# Patient Record
Sex: Male | Born: 1955 | Race: White | Hispanic: No | Marital: Married | Smoking: Never smoker
Health system: Southern US, Community
[De-identification: ages and names within clinical notes are randomized; demographics above are authoritative.]

## PROBLEM LIST (undated history)

## (undated) DIAGNOSIS — K219 Gastro-esophageal reflux disease without esophagitis: Secondary | ICD-10-CM

## (undated) DIAGNOSIS — R0601 Orthopnea: Secondary | ICD-10-CM

## (undated) DIAGNOSIS — E785 Hyperlipidemia, unspecified: Secondary | ICD-10-CM

## (undated) DIAGNOSIS — F0390 Unspecified dementia without behavioral disturbance: Secondary | ICD-10-CM

## (undated) DIAGNOSIS — I10 Essential (primary) hypertension: Secondary | ICD-10-CM

## (undated) DIAGNOSIS — J45909 Unspecified asthma, uncomplicated: Secondary | ICD-10-CM

## (undated) HISTORY — PX: TONSILLECTOMY: SUR1361

## (undated) HISTORY — PX: HERNIA REPAIR: SHX51

---

## 2004-07-08 ENCOUNTER — Emergency Department: Payer: Self-pay | Admitting: Emergency Medicine

## 2008-02-09 ENCOUNTER — Emergency Department: Payer: Self-pay | Admitting: Emergency Medicine

## 2014-04-10 ENCOUNTER — Emergency Department
Admit: 2014-04-10 | Disposition: A | Discharge status: Home / Self Care | Payer: Self-pay | Admitting: Emergency Medicine

## 2014-04-10 LAB — CBC WITH DIFFERENTIAL/PLATELET
BASOS ABS: 0.1 10*3/uL (ref 0.0–0.1)
Basophil %: 1.1 %
Eosinophil #: 0.3 10*3/uL (ref 0.0–0.7)
Eosinophil %: 3.1 %
HCT: 48.8 % (ref 40.0–52.0)
HGB: 16.6 g/dL (ref 13.0–18.0)
Lymphocyte #: 1.9 10*3/uL (ref 1.0–3.6)
Lymphocyte %: 23.1 %
MCH: 29 pg (ref 26.0–34.0)
MCHC: 34.1 g/dL (ref 32.0–36.0)
MCV: 85 fL (ref 80–100)
MONO ABS: 0.9 x10 3/mm (ref 0.2–1.0)
Monocyte %: 11.4 %
NEUTROS ABS: 5 10*3/uL (ref 1.4–6.5)
Neutrophil %: 61.3 %
Platelet: 215 10*3/uL (ref 150–440)
RBC: 5.73 10*6/uL (ref 4.40–5.90)
RDW: 13.9 % (ref 11.5–14.5)
WBC: 8.2 10*3/uL (ref 3.8–10.6)

## 2014-04-10 LAB — COMPREHENSIVE METABOLIC PANEL
ALK PHOS: 78 U/L
ANION GAP: 8 (ref 7–16)
Albumin: 4.1 g/dL
BILIRUBIN TOTAL: 0.8 mg/dL
BUN: 18 mg/dL
CO2: 25 mmol/L
CREATININE: 1.34 mg/dL — AB
Calcium, Total: 9.3 mg/dL
Chloride: 105 mmol/L
EGFR (African American): >60
EGFR (Non-African Amer.): 59 — ABNORMAL LOW
Glucose: 106 mg/dL — ABNORMAL HIGH
Potassium: 3.6 mmol/L
SGOT(AST): 23 U/L
SGPT (ALT): 26 U/L
Sodium: 138 mmol/L
TOTAL PROTEIN: 7.2 g/dL

## 2014-04-10 LAB — PROTIME-INR
INR: 0.9
PROTHROMBIN TIME: 12.8 s

## 2014-04-10 LAB — APTT: ACTIVATED PTT: 29.7 s (ref 23.6–35.9)

## 2014-04-10 LAB — TROPONIN I: Troponin-I: <0.03 ng/mL

## 2014-12-10 ENCOUNTER — Encounter: Payer: Self-pay | Admitting: *Deleted

## 2014-12-13 ENCOUNTER — Ambulatory Visit: Payer: 59 | Admitting: Anesthesiology

## 2014-12-13 ENCOUNTER — Ambulatory Visit
Admission: RE | Admit: 2014-12-13 | Discharge: 2014-12-13 | Disposition: A | Discharge status: Home / Self Care | Payer: 59 | Source: Ambulatory Visit | Attending: Gastroenterology | Admitting: Gastroenterology

## 2014-12-13 ENCOUNTER — Encounter
Admission: RE | Disposition: A | Discharge status: Home / Self Care | Payer: Self-pay | Source: Ambulatory Visit | Attending: Gastroenterology

## 2014-12-13 ENCOUNTER — Encounter: Payer: Self-pay | Admitting: Anesthesiology

## 2014-12-13 DIAGNOSIS — Z7982 Long term (current) use of aspirin: Secondary | ICD-10-CM | POA: Insufficient documentation

## 2014-12-13 DIAGNOSIS — K219 Gastro-esophageal reflux disease without esophagitis: Secondary | ICD-10-CM | POA: Diagnosis not present

## 2014-12-13 DIAGNOSIS — R1310 Dysphagia, unspecified: Secondary | ICD-10-CM | POA: Insufficient documentation

## 2014-12-13 DIAGNOSIS — K210 Gastro-esophageal reflux disease with esophagitis: Secondary | ICD-10-CM | POA: Insufficient documentation

## 2014-12-13 DIAGNOSIS — Z79899 Other long term (current) drug therapy: Secondary | ICD-10-CM | POA: Insufficient documentation

## 2014-12-13 DIAGNOSIS — Z1211 Encounter for screening for malignant neoplasm of colon: Secondary | ICD-10-CM | POA: Diagnosis present

## 2014-12-13 DIAGNOSIS — D125 Benign neoplasm of sigmoid colon: Secondary | ICD-10-CM | POA: Diagnosis not present

## 2014-12-13 DIAGNOSIS — I10 Essential (primary) hypertension: Secondary | ICD-10-CM | POA: Insufficient documentation

## 2014-12-13 DIAGNOSIS — E785 Hyperlipidemia, unspecified: Secondary | ICD-10-CM | POA: Diagnosis not present

## 2014-12-13 DIAGNOSIS — J45909 Unspecified asthma, uncomplicated: Secondary | ICD-10-CM | POA: Diagnosis not present

## 2014-12-13 DIAGNOSIS — K5730 Diverticulosis of large intestine without perforation or abscess without bleeding: Secondary | ICD-10-CM | POA: Insufficient documentation

## 2014-12-13 HISTORY — PX: ESOPHAGOGASTRODUODENOSCOPY (EGD) WITH PROPOFOL: SHX5813

## 2014-12-13 HISTORY — DX: Gastro-esophageal reflux disease without esophagitis: K21.9

## 2014-12-13 HISTORY — DX: Hyperlipidemia, unspecified: E78.5

## 2014-12-13 HISTORY — DX: Orthopnea: R06.01

## 2014-12-13 HISTORY — PX: COLONOSCOPY WITH PROPOFOL: SHX5780

## 2014-12-13 HISTORY — DX: Essential (primary) hypertension: I10

## 2014-12-13 HISTORY — DX: Unspecified asthma, uncomplicated: J45.909

## 2014-12-13 SURGERY — ESOPHAGOGASTRODUODENOSCOPY (EGD) WITH PROPOFOL
Anesthesia: General

## 2014-12-13 MED ORDER — SODIUM CHLORIDE 0.9 % IV SOLN: INTRAVENOUS | Status: DC

## 2014-12-13 MED ORDER — PROPOFOL 500 MG/50ML IV EMUL
INTRAVENOUS | Status: DC | PRN
  Administered 2014-12-13: 140 ug/kg/min via INTRAVENOUS

## 2014-12-13 MED ORDER — LIDOCAINE HCL (CARDIAC) 20 MG/ML IV SOLN
INTRAVENOUS | Status: DC | PRN
  Administered 2014-12-13: 60 mg via INTRAVENOUS

## 2014-12-13 MED ORDER — SODIUM CHLORIDE 0.9 % IV SOLN
INTRAVENOUS | Status: DC
  Administered 2014-12-13: 1000 mL via INTRAVENOUS

## 2014-12-13 MED ORDER — GLYCOPYRROLATE 0.2 MG/ML IJ SOLN
INTRAMUSCULAR | Status: DC | PRN
  Administered 2014-12-13: 0.2 mg via INTRAVENOUS

## 2014-12-13 MED ORDER — IPRATROPIUM-ALBUTEROL 0.5-2.5 (3) MG/3ML IN SOLN: 3.0000 mL | Freq: Once | RESPIRATORY_TRACT | Status: DC

## 2014-12-13 MED ORDER — MIDAZOLAM HCL 2 MG/2ML IJ SOLN
INTRAMUSCULAR | Status: DC | PRN
  Administered 2014-12-13: 2 mg via INTRAVENOUS

## 2014-12-13 MED ORDER — PROPOFOL 10 MG/ML IV BOLUS
INTRAVENOUS | Status: DC | PRN
  Administered 2014-12-13: 20 mg via INTRAVENOUS
  Administered 2014-12-13: 30 mg via INTRAVENOUS
  Administered 2014-12-13: 20 mg via INTRAVENOUS

## 2014-12-13 NOTE — Anesthesia Procedure Notes (Signed)
Date/Time: 12/13/2014 9:34 AM Performed by: Johnna Acosta Pre-anesthesia Checklist: Patient identified, Emergency Drugs available, Suction available, Patient being monitored and Timeout performed Patient Re-evaluated:Patient Re-evaluated prior to inductionOxygen Delivery Method: Nasal cannula

## 2014-12-13 NOTE — H&P (Signed)
  Date of Initial H&P: 12/07/2014  History reviewed, patient examined, no change in status, stable for surgery.

## 2014-12-13 NOTE — Op Note (Signed)
[facility] Gastroenterology Patient Name: Ian Allen Procedure Date: 12/13/2014 9:22 AM MRN: [id number] Account #: [id number] Date of Birth: 08-21-1955 Admit Type: Outpatient Age: 59 Room: [facility] ENDO ROOM 4 Gender: Male Note Status: Finalized Procedure:         Upper GI endoscopy Indications:       Dysphagia Providers:         Lupita Dawn. Candace Cruise, MD Referring MD:      Forest Gleason Md, MD (Referring MD) Medicines:         Monitored Anesthesia Care Complications:     No immediate complications. Procedure:         Pre-Anesthesia Assessment:                    - Prior to the procedure, a History and Physical was                     performed, and patient medications, allergies and                     sensitivities were reviewed. The patient's tolerance of                     previous anesthesia was reviewed.                    - The risks and benefits of the procedure and the sedation                     options and risks were discussed with the patient. All                     questions were answered and informed consent was obtained.                    - After reviewing the risks and benefits, the patient was                     deemed in satisfactory condition to undergo the procedure.                    After obtaining informed consent, the endoscope was passed                     under direct vision. Throughout the procedure, the                     patient's blood pressure, pulse, and oxygen saturations                     were monitored continuously. The Endoscope was introduced                     through the mouth, and advanced to the second part of                     duodenum. The upper GI endoscopy was accomplished without                     difficulty. The patient tolerated the procedure well. Findings:      LA Grade A (one or more mucosal breaks less than 5 mm, not extending       between tops of 2 mucosal folds) esophagitis was found at the   gastroesophageal junction. Biopsies  were taken with a cold forceps for       histology. The scope was withdrawn. Dilation was performed with a       Maloney dilator with mild resistance at 63 Fr.      The exam was otherwise without abnormality.      The entire examined stomach was normal.      The examined duodenum was normal. Impression:        - LA Grade A reflux esophagitis. Biopsied. Dilated.                    - The examination was otherwise normal.                    - Normal stomach.                    - Normal examined duodenum. Recommendation:    - Discharge patient to home.                    - Observe patient's clinical course.                    - Await pathology results.                    - Use a proton pump inhibitor PO daily. Procedure Code(s): --- Professional ---                    [phone], Esophagogastroduodenoscopy, flexible, transoral;                     with biopsy, single or multiple                    43450, Dilation of esophagus, by unguided sound or bougie,                     single or multiple passes Diagnosis Code(s): --- Professional ---                    K21.0, Gastro-esophageal reflux disease with esophagitis                    R13.10, Dysphagia, unspecified CPT copyright 2014 American Medical Association. All rights reserved. The codes documented in this report are preliminary and upon coder review may  be revised to meet current compliance requirements. Hulen Luster, MD 12/13/2014 9:41:12 AM This report has been signed electronically. Number of Addenda: 0 Note Initiated On: 12/13/2014 9:22 AM      [facility]

## 2014-12-13 NOTE — Transfer of Care (Signed)
Immediate Anesthesia Transfer of Care Note  Patient: Ian Allen  Procedure(s) Performed: Procedure(s): ESOPHAGOGASTRODUODENOSCOPY (EGD) WITH PROPOFOL (N/A) COLONOSCOPY WITH PROPOFOL  Patient Location: PACU  Anesthesia Type:General  Level of Consciousness: sedated  Airway & Oxygen Therapy: Patient Spontanous Breathing and Patient connected to nasal cannula oxygen  Post-op Assessment: Report given to RN and Post -op Vital signs reviewed and stable  Post vital signs: Reviewed and stable  Last Vitals:  Filed Vitals:   12/13/14 0901 12/13/14 1005  BP: 123/97 89/70  Pulse: 86 84  Temp: 36.7 C 36.2 C  Resp: 16 16    Complications: No apparent anesthesia complications

## 2014-12-13 NOTE — Op Note (Signed)
[facility] Gastroenterology Patient Name: Ian Allen Procedure Date: 12/13/2014 9:24 AM MRN: [id number] Account #: [id number] Date of Birth: [birth year] Admit Type: Outpatient Age: 59 Room: [facility] ENDO ROOM 4 Gender: Male Note Status: Finalized Procedure:         Colonoscopy Indications:       Screening for colorectal malignant neoplasm Providers:         Lupita Dawn. Candace Cruise, MD Referring MD:      Forest Gleason Md, MD (Referring MD) Medicines:         Monitored Anesthesia Care Complications:     No immediate complications. Procedure:         Pre-Anesthesia Assessment:                    - Prior to the procedure, a History and Physical was                     performed, and patient medications, allergies and                     sensitivities were reviewed. The patient's tolerance of                     previous anesthesia was reviewed.                    - The risks and benefits of the procedure and the sedation                     options and risks were discussed with the patient. All                     questions were answered and informed consent was obtained.                    - After reviewing the risks and benefits, the patient was                     deemed in satisfactory condition to undergo the procedure.                    After obtaining informed consent, the colonoscope was                     passed under direct vision. Throughout the procedure, the                     patient's blood pressure, pulse, and oxygen saturations                     were monitored continuously. The Colonoscope was                     introduced through the anus and advanced to the the cecum,                     identified by appendiceal orifice and ileocecal valve. The                     colonoscopy was performed without difficulty. The patient                     tolerated the procedure well. The quality of the bowel  preparation was good. Findings:      Multiple  small and large-mouthed diverticula were found in the sigmoid       colon.      A large polyp was found in the proximal sigmoid colon. The polyp was       pedunculated. The polyp was removed with a hot snare. Resection and       retrieval were complete.      A medium polyp was found in the mid sigmoid colon. The polyp was       sessile. The polyp was removed with a hot snare. Resection and retrieval       were complete.      A small polyp was found in the distal sigmoid colon. The polyp was       sessile. The polyp was removed with a hot snare. Resection and retrieval       were complete.      The exam was otherwise without abnormality. Impression:        - Diverticulosis in the sigmoid colon.                    - One large polyp in the proximal sigmoid colon. Resected                     and retrieved.                    - One medium polyp in the mid sigmoid colon. Resected and                     retrieved.                    - One small polyp in the distal sigmoid colon. Resected                     and retrieved.                    - The examination was otherwise normal. Recommendation:    - Discharge patient to home.                    - Await pathology results.                    - Repeat colonoscopy in 3 years for surveillance based on                     pathology results.                    - The findings and recommendations were discussed with the                     patient. Procedure Code(s): --- Professional ---                    [id number], Colonoscopy, flexible; with removal of tumor(s),                     polyp(s), or other lesion(s) by snare technique Diagnosis Code(s): --- Professional ---                    Z12.11, Encounter for screening for malignant neoplasm of  colon                    D12.5, Benign neoplasm of sigmoid colon                    K57.30, Diverticulosis of large intestine without                     perforation or abscess without  bleeding CPT copyright 2014 American Medical Association. All rights reserved. The codes documented in this report are preliminary and upon coder review may  be revised to meet current compliance requirements. Hulen Luster, MD 12/13/2014 10:02:04 AM This report has been signed electronically. Number of Addenda: 0 Note Initiated On: 12/13/2014 9:24 AM Scope Withdrawal Time: 0 hours 11 minutes 24 seconds  Total Procedure Duration: 0 hours 15 minutes 29 seconds       [facility]

## 2014-12-13 NOTE — Anesthesia Preprocedure Evaluation (Signed)
Anesthesia Evaluation  Patient identified by MRN, date of birth, ID band Patient awake    Reviewed: Allergy & Precautions, H&P , NPO status , Patient's Chart, lab work & pertinent test results  History of Anesthesia Complications Negative for: history of anesthetic complications  Airway Mallampati: II  TM Distance: >3 FB Neck ROM: full    Dental no notable dental hx. (+) Teeth Intact   Pulmonary neg shortness of breath, asthma ,    Pulmonary exam normal breath sounds clear to auscultation       Cardiovascular Exercise Tolerance: Good hypertension, (-) angina(-) Past MI and (-) DOE Normal cardiovascular exam Rhythm:regular Rate:Normal     Neuro/Psych negative neurological ROS  negative psych ROS   GI/Hepatic negative GI ROS, Neg liver ROS, GERD  Controlled,  Endo/Other  negative endocrine ROS  Renal/GU negative Renal ROS  negative genitourinary   Musculoskeletal   Abdominal   Peds  Hematology negative hematology ROS (+)   Anesthesia Other Findings Past Medical History:   Asthma                                                       GERD (gastroesophageal reflux disease)                       Hypertension                                                 Orthopnea                                                    Hyperlipidemia                                              Past Surgical History:   TONSILLECTOMY                                                 HERNIA REPAIR                                                   Reproductive/Obstetrics negative OB ROS                             Anesthesia Physical Anesthesia Plan  ASA: III  Anesthesia Plan: General   Post-op Pain Management:    Induction:   Airway Management Planned:   Additional Equipment:   Intra-op Plan:   Post-operative Plan:   Informed Consent: I have reviewed the patients History and Physical, chart, labs  and discussed the procedure including the risks, benefits  and alternatives for the proposed anesthesia with the patient or authorized representative who has indicated his/her understanding and acceptance.   Dental Advisory Given  Plan Discussed with: Anesthesiologist, CRNA and Surgeon  Anesthesia Plan Comments:         Anesthesia Quick Evaluation

## 2014-12-13 NOTE — Anesthesia Postprocedure Evaluation (Signed)
Anesthesia Post Note  Patient: Ian Allen  Procedure(s) Performed: Procedure(s) (LRB): ESOPHAGOGASTRODUODENOSCOPY (EGD) WITH PROPOFOL (N/A) COLONOSCOPY WITH PROPOFOL  Patient location during evaluation: Endoscopy Anesthesia Type: General Level of consciousness: awake and alert Pain management: pain level controlled Vital Signs Assessment: post-procedure vital signs reviewed and stable Respiratory status: spontaneous breathing, nonlabored ventilation, respiratory function stable and patient connected to nasal cannula oxygen Cardiovascular status: blood pressure returned to baseline and stable Postop Assessment: no signs of nausea or vomiting Anesthetic complications: no    Last Vitals:  Filed Vitals:   12/13/14 1030 12/13/14 1040  BP: 120/97 125/93  Pulse: 65   Temp:    Resp: 15     Last Pain: There were no vitals filed for this visit.               Precious Haws Piscitello

## 2014-12-14 LAB — SURGICAL PATHOLOGY

## 2014-12-15 ENCOUNTER — Encounter: Payer: Self-pay | Admitting: Gastroenterology

## 2015-06-24 ENCOUNTER — Ambulatory Visit
Admission: EM | Admit: 2015-06-24 | Discharge: 2015-06-24 | Disposition: A | Discharge status: Home / Self Care | Payer: 59 | Attending: Family Medicine | Admitting: Family Medicine

## 2015-06-24 DIAGNOSIS — R109 Unspecified abdominal pain: Secondary | ICD-10-CM

## 2015-06-24 DIAGNOSIS — R319 Hematuria, unspecified: Secondary | ICD-10-CM | POA: Diagnosis not present

## 2015-06-24 DIAGNOSIS — N451 Epididymitis: Secondary | ICD-10-CM | POA: Diagnosis not present

## 2015-06-24 DIAGNOSIS — R1010 Upper abdominal pain, unspecified: Secondary | ICD-10-CM | POA: Diagnosis not present

## 2015-06-24 LAB — URINALYSIS COMPLETE WITH MICROSCOPIC (ARMC ONLY)
Bilirubin Urine: NEGATIVE
GLUCOSE, UA: NEGATIVE mg/dL
Ketones, ur: NEGATIVE mg/dL
Leukocytes, UA: NEGATIVE
NITRITE: NEGATIVE
Protein, ur: 30 mg/dL — AB
Specific Gravity, Urine: 1.025 (ref 1.005–1.030)
Squamous Epithelial / LPF: NONE SEEN
pH: 7 (ref 5.0–8.0)

## 2015-06-24 MED ORDER — LEVOFLOXACIN 500 MG PO TABS: 500.0000 mg | ORAL_TABLET | Freq: Every day | ORAL | Status: DC

## 2015-06-24 MED ORDER — KETOROLAC TROMETHAMINE 60 MG/2ML IM SOLN
60.0000 mg | Freq: Once | INTRAMUSCULAR | Status: AC
  Administered 2015-06-24: 60 mg via INTRAMUSCULAR

## 2015-06-24 MED ORDER — HYDROCODONE-ACETAMINOPHEN 5-325 MG PO TABS: ORAL_TABLET | ORAL | Status: DC

## 2015-06-24 MED ORDER — ONDANSETRON 8 MG PO TBDP
8.0000 mg | ORAL_TABLET | Freq: Once | ORAL | Status: AC
  Administered 2015-06-24: 8 mg via ORAL

## 2015-06-24 NOTE — Discharge Instructions (Signed)
Epididymitis °Epididymitis is swelling (inflammation) of the epididymis. The epididymis is a cord-like structure that is located along the top and back part of the testicle. It collects and stores sperm from the testicle. °This condition can also cause pain and swelling of the testicle and scrotum. Symptoms usually start suddenly (acute epididymitis). Sometimes epididymitis starts gradually and lasts for a while (chronic epididymitis). This type may be harder to treat. °CAUSES °In men 35 and younger, this condition is usually caused by a bacterial infection or sexually transmitted disease (STD), such as: °· Gonorrhea. °· Chlamydia.   °In men 35 and older who do not have anal sex, this condition is usually caused by bacteria from a blockage or abnormalities in the urinary system. These can result from: °· Having a tube placed into the bladder (urinary catheter). °· Having an enlarged or inflamed prostate gland. °· Having recent urinary tract surgery. °In men who have a condition that weakens the body's defense system (immune system), such as HIV, this condition can be caused by:  °· Other bacteria, including tuberculosis and syphilis. °· Viruses. °· Fungi. °Sometimes this condition occurs without infection. That may happen if urine flows backward into the epididymis after heavy lifting or straining. °RISK FACTORS °This condition is more likely to develop in men: °· Who have unprotected sex with more than one partner. °· Who have anal sex.   °· Who have recently had surgery.   °· Who have a urinary catheter. °· Who have urinary problems. °· Who have a suppressed immune system.   °SYMPTOMS  °This condition usually begins suddenly with chills, fever, and pain behind the scrotum and in the testicle. Other symptoms include:  °· Swelling of the scrotum, testicle, or both. °· Pain when ejaculating or urinating. °· Pain in the back or belly. °· Nausea. °· Itching and discharge from the penis. °· Frequent need to pass  urine. °· Redness and tenderness of the scrotum. °DIAGNOSIS °Your health care provider can diagnose this condition based on your symptoms and medical history. Your health care provider will also do a physical exam to ask about your symptoms and check your scrotum and testicle for swelling, pain, and redness. You may also have other tests, including:   °· Examination of discharge from the penis. °· Urine tests for infections, such as STDs.   °Your health care provider may test you for other STDs, including HIV.  °TREATMENT °Treatment for this condition depends on the cause. If your condition is caused by a bacterial infection, oral antibiotic medicine may be prescribed. If the bacterial infection has spread to your blood, you may need to receive IV antibiotics. Nonbacterial epididymitis is treated with home care that includes bed rest and elevation of the scrotum. °Surgery may be needed to treat: °· Bacterial epididymitis that causes pus to build up in the scrotum (abscess). °· Chronic epididymitis that has not responded to other treatments. °HOME CARE INSTRUCTIONS °Medicines  °· Take over-the-counter and prescription medicines only as told by your health care provider.   °· If you were prescribed an antibiotic medicine, take it as told by your health care provider. Do not stop taking the antibiotic even if your condition improves. °Sexual Activity  °· If your epididymitis was caused by an STD, avoid sexual activity until your treatment is complete. °· Inform your sexual partner or partners if you test positive for an STD. They may need to be treated. Do not engage in sexual activity with your partner or partners until their treatment is completed. °General Instructions  °· Return to your normal activities as told   by your health care provider. Ask your health care provider what activities are safe for you.  Keep your scrotum elevated and supported while resting. Ask your health care provider if you should wear a  scrotal support, such as a jockstrap. Wear it as told by your health care provider.  If directed, apply ice to the affected area:   Put ice in a plastic bag.  Place a towel between your skin and the bag.  Leave the ice on for 20 minutes, 2-3 times per day.  Try taking a sitz bath to help with discomfort. This is a warm water bath that is taken while you are sitting down. The water should only come up to your hips and should cover your buttocks. Do this 3-4 times per day or as told by your health care provider.  Keep all follow-up visits as told by your health care provider. This is important. SEEK MEDICAL CARE IF:   You have a fever.   Your pain medicine is not helping.   Your pain is getting worse.   Your symptoms do not improve within three days.   This information is not intended to replace advice given to you by your health care provider. Make sure you discuss any questions you have with your health care provider.   Document Released: 12/16/1999 Document Revised: 09/08/2014 Document Reviewed: 05/05/2014 Elsevier Interactive Patient Education 2016 [place]. Kidney Stones Kidney stones (urolithiasis) are deposits that form inside your kidneys. The intense pain is caused by the stone moving through the urinary tract. When the stone moves, the ureter goes into spasm around the stone. The stone is usually passed in the urine.  CAUSES   A disorder that makes certain neck glands produce too much parathyroid hormone (primary hyperparathyroidism).  A buildup of uric acid crystals, similar to gout in your joints.  Narrowing (stricture) of the ureter.  A kidney obstruction present at birth (congenital obstruction).  Previous surgery on the kidney or ureters.  Numerous kidney infections. SYMPTOMS   Feeling sick to your stomach (nauseous).  Throwing up (vomiting).  Blood in the urine (hematuria).  Pain that usually spreads (radiates) to the groin.  Frequency or  urgency of urination. DIAGNOSIS   Taking a history and physical exam.  Blood or urine tests.  CT scan.  Occasionally, an examination of the inside of the urinary bladder (cystoscopy) is performed. TREATMENT   Observation.  Increasing your fluid intake.  Extracorporeal shock wave lithotripsy--This is a noninvasive procedure that uses shock waves to break up kidney stones.  Surgery may be needed if you have severe pain or persistent obstruction. There are various surgical procedures. Most of the procedures are performed with the use of small instruments. Only small incisions are needed to accommodate these instruments, so recovery time is minimized. The size, location, and chemical composition are all important variables that will determine the proper choice of action for you. Talk to your health care provider to better understand your situation so that you will minimize the risk of injury to yourself and your kidney.  HOME CARE INSTRUCTIONS   Drink enough water and fluids to keep your urine clear or pale yellow. This will help you to pass the stone or stone fragments.  Strain all urine through the provided strainer. Keep all particulate matter and stones for your health care provider to see. The stone causing the pain may be as small as a grain of salt. It is very important to use the strainer each  and every time you pass your urine. The collection of your stone will allow your health care provider to analyze it and verify that a stone has actually passed. The stone analysis will often identify what you can do to reduce the incidence of recurrences.  Only take over-the-counter or prescription medicines for pain, discomfort, or fever as directed by your health care provider.  Keep all follow-up visits as told by your health care provider. This is important.  Get follow-up X-rays if required. The absence of pain does not always mean that the stone has passed. It may have only stopped  moving. If the urine remains completely obstructed, it can cause loss of kidney function or even complete destruction of the kidney. It is your responsibility to make sure X-rays and follow-ups are completed. Ultrasounds of the kidney can show blockages and the status of the kidney. Ultrasounds are not associated with any radiation and can be performed easily in a matter of minutes.  Make changes to your daily diet as told by your health care provider. You may be told to:  Limit the amount of salt that you eat.  Eat 5 or more servings of fruits and vegetables each day.  Limit the amount of meat, poultry, fish, and eggs that you eat.  Collect a 24-hour urine sample as told by your health care provider.You may need to collect another urine sample every 6-12 months. SEEK MEDICAL CARE IF:  You experience pain that is progressive and unresponsive to any pain medicine you have been prescribed. SEEK IMMEDIATE MEDICAL CARE IF:   Pain cannot be controlled with the prescribed medicine.  You have a fever or shaking chills.  The severity or intensity of pain increases over 18 hours and is not relieved by pain medicine.  You develop a new onset of abdominal pain.  You feel faint or pass out.  You are unable to urinate.   This information is not intended to replace advice given to you by your health care provider. Make sure you discuss any questions you have with your health care provider.   Document Released: 12/18/2004 Document Revised: 09/08/2014 Document Reviewed: 05/21/2012 Elsevier Interactive Patient Education [employer].

## 2015-06-24 NOTE — ED Provider Notes (Signed)
CSN: [id number]     Arrival date & time 06/24/15  1342 History   First MD Initiated Contact with Patient 06/24/15 1438     Chief Complaint  Patient presents with  . Back Pain  . Testicle Pain   (Consider location/radiation/quality/duration/timing/severity/associated sxs/prior Treatment) HPI Comments: 60 yo male with a h/o kidney stones in the past presents with a complaint of right flank pain, constant but intermittently worsens, radiating to the groin and associated with nausea. Denies any fevers, chills, vomiting, dysuria, penile discharge, injuries, concern for STD. He does also complain of slight swelling, redness and pain to the right scrotum.   Patient is a 60 y.o. male presenting with back pain and testicular pain. The history is provided by the patient.  Back Pain Testicle Pain    Past Medical History  Diagnosis Date  . Asthma   . GERD (gastroesophageal reflux disease)   . Hypertension   . Orthopnea   . Hyperlipidemia    Past Surgical History  Procedure Laterality Date  . Tonsillectomy    . Hernia repair    . Esophagogastroduodenoscopy (egd) with propofol N/A 12/13/2014    Procedure: ESOPHAGOGASTRODUODENOSCOPY (EGD) WITH PROPOFOL;  Surgeon: Hulen Luster, MD;  Location: [facility] ENDOSCOPY;  Service: Gastroenterology;  Laterality: N/A;  . Colonoscopy with propofol  12/13/2014    Procedure: COLONOSCOPY WITH PROPOFOL;  Surgeon: Hulen Luster, MD;  Location: [facility] ENDOSCOPY;  Service: Gastroenterology;;   Family History  Problem Relation Age of Onset  . Cirrhosis Mother    Social History  Substance Use Topics  . Smoking status: Never Smoker   . Smokeless tobacco: Never Used  . Alcohol Use: 0.0 oz/week    0 Standard drinks or equivalent per week     Comment: rarely    Review of Systems  Genitourinary: Positive for testicular pain.  Musculoskeletal: Positive for back pain.    Allergies  Review of patient's allergies indicates no known allergies.  Home Medications   Prior  to Admission medications   Medication Sig Start Date End Date Taking? Authorizing Provider  amLODipine (NORVASC) 10 MG tablet Take 10 mg by mouth daily.   Yes Historical Provider, MD  levothyroxine (SYNTHROID, LEVOTHROID) 50 MCG tablet Take 50 mcg by mouth daily before breakfast.   Yes Historical Provider, MD  lisinopril (PRINIVIL,ZESTRIL) 20 MG tablet Take 20 mg by mouth daily.   Yes Historical Provider, MD  metoprolol tartrate (LOPRESSOR) 25 MG tablet Take 25 mg by mouth 2 (two) times daily.   Yes Historical Provider, MD  pantoprazole (PROTONIX) 20 MG tablet Take 20 mg by mouth daily.   Yes Historical Provider, MD  sildenafil (VIAGRA) 100 MG tablet Take 100 mg by mouth daily as needed for erectile dysfunction.   Yes Historical Provider, MD  HYDROcodone-acetaminophen (NORCO/VICODIN) 5-325 MG tablet 1-2 tabs po q 8 hours prn 06/24/15   Norval Gable, MD  levofloxacin (LEVAQUIN) 500 MG tablet Take 1 tablet (500 mg total) by mouth daily. 06/24/15   Norval Gable, MD  Sulfacetamide-Prednisolone (VASOCIDIN OP) Apply to eye.    Historical Provider, MD   Meds Ordered and Administered this Visit   Medications  ketorolac (TORADOL) injection 60 mg (60 mg Intramuscular Given 06/24/15 1500)  ondansetron (ZOFRAN-ODT) disintegrating tablet 8 mg (8 mg Oral Given 06/24/15 1459)    BP 174/107 mmHg  Pulse 63  Temp(Src) 97.8 F (36.6 C) (Oral)  Resp 17  Ht 5' 11.5" (1.816 m)  Wt 254 lb (115.214 kg)  BMI 34.94 kg/m2  SpO2 100% No data found.   Physical Exam  Constitutional: He is oriented to person, place, and time. He appears well-developed and well-nourished. No distress.  HENT:  Head: Normocephalic and atraumatic.  Cardiovascular: Normal rate, regular rhythm, normal heart sounds and intact distal pulses.   No murmur heard. Pulmonary/Chest: Effort normal and breath sounds normal. No respiratory distress. He has no wheezes. He has no rales.  Abdominal: Soft. Bowel sounds are normal. He exhibits no  distension and no mass. There is tenderness (mild to moderate to palpation of right flank area; no rebound or guarding). There is no rebound and no guarding.  Genitourinary: Penis normal. Right testis shows swelling (mild) and tenderness. Right testis shows no mass. Right testis is descended. Cremasteric reflex is not absent on the right side. Left testis shows no mass, no swelling and no tenderness. Left testis is descended. Cremasteric reflex is not absent on the left side.  Neurological: He is alert and oriented to person, place, and time.  Skin: No rash noted. He is not diaphoretic.  Nursing note and vitals reviewed.   ED Course  Procedures (including critical care time)  Labs Review Labs Reviewed  URINALYSIS COMPLETEWITH MICROSCOPIC ([facility] ONLY) - Abnormal; Notable for the following:    Color, Urine BROWN (*)    APPearance CLOUDY (*)    Hgb urine dipstick 3+ (*)    Protein, ur 30 (*)    Bacteria, UA RARE (*)    All other components within normal limits    Imaging Review No results found.   Visual Acuity Review  Right Eye Distance:   Left Eye Distance:   Bilateral Distance:    Right Eye Near:   Left Eye Near:    Bilateral Near:         MDM   1. Flank pain, acute   2. Hematuria   3. Epididymitis    New Prescriptions   HYDROCODONE-ACETAMINOPHEN (NORCO/VICODIN) 5-325 MG TABLET    1-2 tabs po q 8 hours prn   LEVOFLOXACIN (LEVAQUIN) 500 MG TABLET    Take 1 tablet (500 mg total) by mouth daily.   1. Lab results and diagnoses reviewed with patient; patient given toradol 60mg  IM x1 and zofran 8mg  ODT with improvement of symptoms  2. rx as per orders above; reviewed possible side effects, interactions, risks and benefits  3. Recommend supportive treatment with increased water intake 4. Follow-up prn if symptoms worsen or don't improve    Norval Gable, MD 06/24/15 [phone]

## 2015-06-24 NOTE — ED Notes (Signed)
Patient complains of back pain, abdominal pain and testicle pain. Patient states that he woke up with pain this morning. Patient states that his testicles are aching and feel heavy. Patient states that he has been throwing up and has taken some pepto bismol.

## 2018-08-26 ENCOUNTER — Other Ambulatory Visit: Payer: Self-pay | Admitting: Gastroenterology

## 2018-08-26 DIAGNOSIS — K219 Gastro-esophageal reflux disease without esophagitis: Secondary | ICD-10-CM

## 2018-08-26 DIAGNOSIS — R1310 Dysphagia, unspecified: Secondary | ICD-10-CM

## 2018-09-02 ENCOUNTER — Ambulatory Visit
Admission: RE | Admit: 2018-09-02 | Discharge: 2018-09-02 | Disposition: A | Discharge status: Home / Self Care | Payer: 59 | Source: Ambulatory Visit | Attending: Gastroenterology | Admitting: Gastroenterology

## 2018-09-02 ENCOUNTER — Other Ambulatory Visit: Payer: Self-pay

## 2018-09-02 DIAGNOSIS — R1310 Dysphagia, unspecified: Secondary | ICD-10-CM | POA: Insufficient documentation

## 2018-09-02 DIAGNOSIS — K219 Gastro-esophageal reflux disease without esophagitis: Secondary | ICD-10-CM | POA: Diagnosis present

## 2018-10-15 ENCOUNTER — Other Ambulatory Visit (HOSPITAL_COMMUNITY): Payer: Self-pay | Admitting: Gastroenterology

## 2018-10-15 ENCOUNTER — Other Ambulatory Visit: Payer: Self-pay | Admitting: Gastroenterology

## 2018-10-15 DIAGNOSIS — K222 Esophageal obstruction: Secondary | ICD-10-CM

## 2018-10-15 DIAGNOSIS — R933 Abnormal findings on diagnostic imaging of other parts of digestive tract: Secondary | ICD-10-CM

## 2018-10-23 ENCOUNTER — Ambulatory Visit
Admission: RE | Admit: 2018-10-23 | Discharge: 2018-10-23 | Disposition: A | Discharge status: Home / Self Care | Payer: Managed Care, Other (non HMO) | Source: Ambulatory Visit | Attending: Gastroenterology | Admitting: Gastroenterology

## 2018-10-23 ENCOUNTER — Other Ambulatory Visit: Payer: Self-pay

## 2018-10-23 DIAGNOSIS — R933 Abnormal findings on diagnostic imaging of other parts of digestive tract: Secondary | ICD-10-CM | POA: Diagnosis present

## 2018-10-23 DIAGNOSIS — K222 Esophageal obstruction: Secondary | ICD-10-CM | POA: Insufficient documentation

## 2018-10-23 LAB — POCT I-STAT CREATININE: Creatinine, Ser: 1.1 mg/dL (ref 0.61–1.24)

## 2018-10-23 MED ORDER — IOHEXOL 300 MG/ML  SOLN
75.0000 mL | Freq: Once | INTRAMUSCULAR | Status: AC | PRN
  Administered 2018-10-23: 75 mL via INTRAVENOUS

## 2019-02-17 ENCOUNTER — Ambulatory Visit: Payer: Managed Care, Other (non HMO) | Attending: Internal Medicine

## 2019-02-17 DIAGNOSIS — Z20822 Contact with and (suspected) exposure to covid-19: Secondary | ICD-10-CM

## 2019-02-18 LAB — NOVEL CORONAVIRUS, NAA: SARS-CoV-2, NAA: NOT DETECTED

## 2019-03-12 ENCOUNTER — Other Ambulatory Visit (HOSPITAL_COMMUNITY): Payer: Self-pay | Admitting: Neurology

## 2019-03-12 DIAGNOSIS — R4189 Other symptoms and signs involving cognitive functions and awareness: Secondary | ICD-10-CM

## 2019-04-03 ENCOUNTER — Other Ambulatory Visit: Payer: Self-pay

## 2019-04-03 ENCOUNTER — Ambulatory Visit (HOSPITAL_COMMUNITY)
Admission: RE | Admit: 2019-04-03 | Discharge: 2019-04-03 | Disposition: A | Discharge status: Home / Self Care | Payer: Managed Care, Other (non HMO) | Source: Ambulatory Visit | Attending: Neurology | Admitting: Neurology

## 2019-04-03 DIAGNOSIS — R4189 Other symptoms and signs involving cognitive functions and awareness: Secondary | ICD-10-CM | POA: Diagnosis present

## 2019-04-03 MED ORDER — GADOBUTROL 1 MMOL/ML IV SOLN
10.0000 mL | Freq: Once | INTRAVENOUS | Status: AC | PRN
  Administered 2019-04-03: 10 mL via INTRAVENOUS

## 2019-05-05 ENCOUNTER — Ambulatory Visit: Payer: Managed Care, Other (non HMO) | Attending: Internal Medicine

## 2019-05-05 DIAGNOSIS — Z23 Encounter for immunization: Secondary | ICD-10-CM

## 2019-05-05 NOTE — Progress Notes (Signed)
   Covid-19 Vaccination Clinic  Name:  Ian Allen    MRN: [id number] DOB: Oct 31, 1955  05/05/2019  Mr. Churchwell was observed post Covid-19 immunization for 15 minutes without incident. He was provided with Vaccine Information Sheet and instruction to access the V-Safe system.   Mr. Boelter was instructed to call 911 with any severe reactions post vaccine: Marland Kitchen Difficulty breathing  . Swelling of face and throat  . A fast heartbeat  . A bad rash all over body  . Dizziness and weakness   Immunizations Administered    Name Date Dose VIS Date Route   Moderna COVID-19 Vaccine 05/05/2019  5:08 PM 0.5 mL 12/2018 Intramuscular   Manufacturer: Moderna   Lot: [id number]   [id number]

## 2020-01-06 ENCOUNTER — Other Ambulatory Visit: Payer: Self-pay

## 2020-01-06 ENCOUNTER — Other Ambulatory Visit: Payer: Managed Care, Other (non HMO)

## 2020-01-06 DIAGNOSIS — Z20822 Contact with and (suspected) exposure to covid-19: Secondary | ICD-10-CM

## 2020-01-09 LAB — NOVEL CORONAVIRUS, NAA: SARS-CoV-2, NAA: DETECTED — AB

## 2020-06-15 ENCOUNTER — Encounter: Payer: Self-pay | Admitting: Intensive Care

## 2020-06-15 ENCOUNTER — Emergency Department
Admission: EM | Admit: 2020-06-15 | Discharge: 2020-06-15 | Disposition: A | Discharge status: Home / Self Care | Payer: Managed Care, Other (non HMO) | Attending: Emergency Medicine | Admitting: Emergency Medicine

## 2020-06-15 ENCOUNTER — Other Ambulatory Visit: Payer: Self-pay

## 2020-06-15 DIAGNOSIS — Z79899 Other long term (current) drug therapy: Secondary | ICD-10-CM | POA: Diagnosis not present

## 2020-06-15 DIAGNOSIS — I10 Essential (primary) hypertension: Secondary | ICD-10-CM | POA: Diagnosis not present

## 2020-06-15 DIAGNOSIS — R1310 Dysphagia, unspecified: Secondary | ICD-10-CM | POA: Diagnosis present

## 2020-06-15 MED ORDER — GLUCAGON HCL (RDNA) 1 MG IJ SOLR
1.0000 mg | Freq: Once | INTRAMUSCULAR | Status: AC
  Filled 2020-06-15: qty 1

## 2020-06-15 MED ORDER — GLUCAGON HCL RDNA (DIAGNOSTIC) 1 MG IJ SOLR
INTRAMUSCULAR | Status: AC
  Administered 2020-06-15: 1 mg via INTRAMUSCULAR
  Filled 2020-06-15: qty 1

## 2020-06-15 NOTE — ED Provider Notes (Signed)
[facility] Emergency Department Provider Note  ____________________________________________   Event Date/Time   First MD Initiated Contact with Patient 06/15/20 1739     (approximate)  I have reviewed the triage vital signs and the nursing notes.   HISTORY  Chief Complaint Aspiration   HPI Ian Allen is a 65 y.o. male with a past medical history of HTN, HDL, GERD and dysphagia followed by GI at [facility] as well as mild cognitive impairment followed by neurology return to clinic who presents for assessment with concerns he got a hot dog stuck in his throat.  Patient states that he was eating a hot dog earlier today when he felt that suddenly could not swallow anything.  He was spitting up anything intractable after that.  He states he received some glucagon in the lobby and felt that the piece of hotdog passed as he felt immediate relief and has been able to drink soda without difficulty since.  He has not had any chest pain, shortness of breath, cough, fevers, vomiting, diarrhea, dysuria, Donnell pain, back pain, headache, earache, sore throat, rash or any other associated recent sick symptoms.  States he has had similar problems in the past.  He denies any other acute concerns and states his feeling that he cannot swallow is now resolved.         Past Medical History:  Diagnosis Date   GERD (gastroesophageal reflux disease)    Hyperlipidemia    Hypertension    Orthopnea     There are no problems to display for this patient.   Past Surgical History:  Procedure Laterality Date   COLONOSCOPY WITH PROPOFOL  12/13/2014   Procedure: COLONOSCOPY WITH PROPOFOL;  Surgeon: Hulen Luster, MD;  Location: [facility] ENDOSCOPY;  Service: Gastroenterology;;   ESOPHAGOGASTRODUODENOSCOPY (EGD) WITH PROPOFOL N/A 12/13/2014   Procedure: ESOPHAGOGASTRODUODENOSCOPY (EGD) WITH PROPOFOL;  Surgeon: Hulen Luster, MD;  Location: [facility] ENDOSCOPY;  Service: Gastroenterology;  Laterality:  N/A;   HERNIA REPAIR     TONSILLECTOMY      Prior to Admission medications   Medication Sig Start Date End Date Taking? Authorizing Provider  amLODipine (NORVASC) 10 MG tablet Take 10 mg by mouth daily.    [provider]  HYDROcodone-acetaminophen (NORCO/VICODIN) 5-325 MG tablet 1-2 tabs po q 8 hours prn 06/24/15   Norval Gable, MD  levofloxacin (LEVAQUIN) 500 MG tablet Take 1 tablet (500 mg total) by mouth daily. 06/24/15   Norval Gable, MD  levothyroxine (SYNTHROID, LEVOTHROID) 50 MCG tablet Take 50 mcg by mouth daily before breakfast.    [provider]  lisinopril (PRINIVIL,ZESTRIL) 20 MG tablet Take 20 mg by mouth daily.    [provider]  metoprolol tartrate (LOPRESSOR) 25 MG tablet Take 25 mg by mouth 2 (two) times daily.    [provider]  pantoprazole (PROTONIX) 20 MG tablet Take 20 mg by mouth daily.    [provider]  sildenafil (VIAGRA) 100 MG tablet Take 100 mg by mouth daily as needed for erectile dysfunction.    [provider]  Sulfacetamide-Prednisolone (VASOCIDIN OP) Apply to eye.    [provider]    Allergies Patient has no known allergies.  Family History  Problem Relation Age of Onset   Cirrhosis Mother     Social History Social History   Tobacco Use   Smoking status: Never   Smokeless tobacco: Never  Substance Use Topics   Alcohol use: Yes    Alcohol/week: 0.0 standard drinks  Comment: rarely   Drug use: No    Review of Systems  Review of Systems  Constitutional:  Negative for chills and fever.  HENT:  Negative for sore throat.   Eyes:  Negative for pain.  Respiratory:  Negative for cough and stridor.   Cardiovascular:  Negative for chest pain.  Gastrointestinal:  Negative for vomiting.  Skin:  Negative for rash.  Neurological:  Negative for seizures, loss of consciousness and headaches.  Psychiatric/Behavioral:  Negative for suicidal ideas.   All other systems reviewed  and are negative.    ____________________________________________   PHYSICAL EXAM:  VITAL SIGNS: ED Triage Vitals  Enc Vitals Group     BP 06/15/20 1532 (!) 143/90     Pulse Rate 06/15/20 1532 74     Resp 06/15/20 1532 (!) 26     Temp 06/15/20 1532 98.6 F (37 C)     Temp Source 06/15/20 1532 Oral     SpO2 --      Weight 06/15/20 1534 254 lb (115.2 kg)     Height 06/15/20 1534 6' (1.829 m)     Head Circumference --      Peak Flow --      Pain Score 06/15/20 1534 10     Pain Loc --      Pain Edu? --      Excl. in [place]? --    Vitals:   06/15/20 1532  BP: (!) 143/90  Pulse: 74  Resp: (!) 26  Temp: 98.6 F (37 C)   Physical Exam Vitals and nursing note reviewed.  Constitutional:      Appearance: He is well-developed.  HENT:     Head: Normocephalic and atraumatic.     Right Ear: External ear normal.     Left Ear: External ear normal.     Nose: Nose normal.     Mouth/Throat:     Mouth: Mucous membranes are moist.     Pharynx: Oropharynx is clear. No oropharyngeal exudate or posterior oropharyngeal erythema.  Eyes:     Conjunctiva/sclera: Conjunctivae normal.  Cardiovascular:     Rate and Rhythm: Normal rate and regular rhythm.     Heart sounds: No murmur heard. Pulmonary:     Effort: Pulmonary effort is normal. No respiratory distress.     Breath sounds: Normal breath sounds. No stridor.  Abdominal:     Palpations: Abdomen is soft.     Tenderness: There is no abdominal tenderness.  Musculoskeletal:     Cervical back: Neck supple.  Skin:    General: Skin is warm and dry.     Capillary Refill: Capillary refill takes less than 2 seconds.  Neurological:     Mental Status: He is alert and oriented to person, place, and time.     ____________________________________________   LABS (all labs ordered are listed, but only abnormal results are displayed)  Labs Reviewed - No data to  display ____________________________________________  EKG   ____________________________________________  RADIOLOGY  ED MD interpretation:    Official radiology report(s): No results found.  ____________________________________________   PROCEDURES  Procedure(s) performed (including Critical Care):  Procedures   ____________________________________________   INITIAL IMPRESSION / ASSESSMENT AND PLAN / ED COURSE      Patient presents with above-stated history and exam with concerns he got a piece of hotdog stuck in his esophagus.  On arrival he is slight tachypneic with otherwise stable vital signs.  He did receive some glucagon in triage prior to my assessment states that  this relieved his symptoms.  He is subsequently been on tolerate p.o. without difficulties.  On my exam he is tolerating secretions without any difficulty and has no stridor or abnormalities in his oropharynx or neck.  Lungs are clear bilaterally.  Overall clinically it seems patient has passed a piece of hotdog that historically sounds like it was lodged in his esophagus.  He has no other historical or exam features i.e. persistent pain or sensation of fullness or inability tolerate p.o. that would suggest persistent obstruction, perforation, acute infectious process significant metabolic derangement or other immediate life-threatening process.  Given patient is now tolerating p.o. I think is stable for outpatient GI follow-up.  Advised him to maintain soft diet with pure consistency given his recurrent problem for the patient until he can get into see his GI doctor.  He is amenable to this.  Discharged stable condition.  Strict return precautions advised and discussed.      ____________________________________________   FINAL CLINICAL IMPRESSION(S) / ED DIAGNOSES  Final diagnoses:  Dysphagia, unspecified type    Medications  glucagon (GLUCAGEN) injection 1 mg (1 mg Intramuscular Given 06/15/20 1546)      ED Discharge Orders     None        Note:  This document was prepared using Dragon voice recognition software and may include unintentional dictation errors.    Lucrezia Starch, MD 06/15/20 [phone]

## 2020-06-15 NOTE — ED Triage Notes (Signed)
Pt comes into the ED via [facility] from [place] c/o choking on a hotdog.  Pt was able to spit up the bread, but has been unable to get the hot dog up.  Pt has even and unlabored respirations at this time.  Pt has no stridor present.  Pt has VS WNL.

## 2020-07-02 ENCOUNTER — Emergency Department (HOSPITAL_COMMUNITY)
Admission: EM | Admit: 2020-07-02 | Discharge: 2020-07-02 | Disposition: A | Discharge status: Home / Self Care | Payer: Managed Care, Other (non HMO) | Attending: Emergency Medicine | Admitting: Emergency Medicine

## 2020-07-02 ENCOUNTER — Encounter (HOSPITAL_COMMUNITY): Payer: Self-pay

## 2020-07-02 ENCOUNTER — Other Ambulatory Visit: Payer: Self-pay

## 2020-07-02 ENCOUNTER — Emergency Department (HOSPITAL_COMMUNITY): Payer: Managed Care, Other (non HMO)

## 2020-07-02 DIAGNOSIS — Z79899 Other long term (current) drug therapy: Secondary | ICD-10-CM | POA: Insufficient documentation

## 2020-07-02 DIAGNOSIS — I10 Essential (primary) hypertension: Secondary | ICD-10-CM | POA: Insufficient documentation

## 2020-07-02 DIAGNOSIS — X58XXXA Exposure to other specified factors, initial encounter: Secondary | ICD-10-CM | POA: Diagnosis not present

## 2020-07-02 DIAGNOSIS — R0789 Other chest pain: Secondary | ICD-10-CM | POA: Insufficient documentation

## 2020-07-02 DIAGNOSIS — T18128A Food in esophagus causing other injury, initial encounter: Secondary | ICD-10-CM | POA: Diagnosis not present

## 2020-07-02 LAB — CBC WITH DIFFERENTIAL/PLATELET
Abs Immature Granulocytes: 0.01 10*3/uL (ref 0.00–0.07)
Basophils Absolute: 0 10*3/uL (ref 0.0–0.1)
Basophils Relative: 1 %
Eosinophils Absolute: 0.2 10*3/uL (ref 0.0–0.5)
Eosinophils Relative: 3 %
HCT: 48.3 % (ref 39.0–52.0)
Hemoglobin: 17.1 g/dL — ABNORMAL HIGH (ref 13.0–17.0)
Immature Granulocytes: 0 %
Lymphocytes Relative: 25 %
Lymphs Abs: 1.8 10*3/uL (ref 0.7–4.0)
MCH: 30.4 pg (ref 26.0–34.0)
MCHC: 35.4 g/dL (ref 30.0–36.0)
MCV: 85.8 fL (ref 80.0–100.0)
Monocytes Absolute: 0.5 10*3/uL (ref 0.1–1.0)
Monocytes Relative: 8 %
Neutro Abs: 4.5 10*3/uL (ref 1.7–7.7)
Neutrophils Relative %: 63 %
Platelets: 241 10*3/uL (ref 150–400)
RBC: 5.63 MIL/uL (ref 4.22–5.81)
RDW: 14.1 % (ref 11.5–15.5)
WBC: 7 10*3/uL (ref 4.0–10.5)
nRBC: 0 % (ref 0.0–0.2)

## 2020-07-02 LAB — COMPREHENSIVE METABOLIC PANEL
ALT: 42 U/L (ref 0–44)
AST: 33 U/L (ref 15–41)
Albumin: 4.2 g/dL (ref 3.5–5.0)
Alkaline Phosphatase: 79 U/L (ref 38–126)
Anion gap: 12 (ref 5–15)
BUN: 16 mg/dL (ref 8–23)
CO2: 21 mmol/L — ABNORMAL LOW (ref 22–32)
Calcium: 9.4 mg/dL (ref 8.9–10.3)
Chloride: 107 mmol/L (ref 98–111)
Creatinine, Ser: 1.09 mg/dL (ref 0.61–1.24)
GFR, Estimated: >60 mL/min (ref >60)
Glucose, Bld: 141 mg/dL — ABNORMAL HIGH (ref 70–99)
Potassium: 3.5 mmol/L (ref 3.5–5.1)
Sodium: 140 mmol/L (ref 135–145)
Total Bilirubin: 0.7 mg/dL (ref 0.3–1.2)
Total Protein: 7.4 g/dL (ref 6.5–8.1)

## 2020-07-02 LAB — LIPASE, BLOOD: Lipase: 39 U/L (ref 11–51)

## 2020-07-02 MED ORDER — GLUCAGON HCL RDNA (DIAGNOSTIC) 1 MG IJ SOLR
1.0000 mg | Freq: Once | INTRAMUSCULAR | Status: AC
  Administered 2020-07-02: 1 mg via INTRAVENOUS
  Filled 2020-07-02: qty 1

## 2020-07-02 MED ORDER — ONDANSETRON HCL 4 MG/2ML IJ SOLN
4.0000 mg | Freq: Once | INTRAMUSCULAR | Status: AC
  Administered 2020-07-02: 4 mg via INTRAVENOUS
  Filled 2020-07-02: qty 2

## 2020-07-02 MED ORDER — LIDOCAINE VISCOUS HCL 2 % MT SOLN
15.0000 mL | Freq: Once | OROMUCOSAL | Status: AC
  Administered 2020-07-02: 15 mL via ORAL
  Filled 2020-07-02: qty 15

## 2020-07-02 MED ORDER — ALUM & MAG HYDROXIDE-SIMETH 200-200-20 MG/5ML PO SUSP
30.0000 mL | Freq: Once | ORAL | Status: AC
  Administered 2020-07-02: 30 mL via ORAL
  Filled 2020-07-02: qty 30

## 2020-07-02 MED ORDER — SODIUM CHLORIDE 0.9 % IV BOLUS
1000.0000 mL | Freq: Once | INTRAVENOUS | Status: AC
  Administered 2020-07-02: 1000 mL via INTRAVENOUS

## 2020-07-02 NOTE — ED Provider Notes (Signed)
[facility] DEPT Provider Note   CSN: [id number] Arrival date & time: 07/02/20  1740     History Chief Complaint  Patient presents with  . Food Bolus    Ian Allen is a 65 y.o. male.  HPI     Ian Allen is a 65 y.o. male, with a history of GERD, hyperlipidemia, HTN, obesity, presenting to the ED with sensation of food impaction in the esophagus.  Patient states he was eating beef and chicken at [employer] when he began to choke and was unable to breathe.  He had the Heimlich maneuver performed on him by multiple bystanders.  He was able to breathe, but then was unable to swallow even liquids.  He states he even had difficulty swallowing his secretions. He states he began retching in the parking lot of the ED, throw up some rice, and is now able to tolerate his secretions.  He endorses a soreness in the chest as well as the upper abdomen.  Denies syncope, dizziness, other abdominal discomfort, or any other complaints.  Past Medical History:  Diagnosis Date  . GERD (gastroesophageal reflux disease)   . Hyperlipidemia   . Hypertension   . Orthopnea     There are no problems to display for this patient.   Past Surgical History:  Procedure Laterality Date  . COLONOSCOPY WITH PROPOFOL  12/13/2014   Procedure: COLONOSCOPY WITH PROPOFOL;  Surgeon: Hulen Luster, MD;  Location: [facility] ENDOSCOPY;  Service: Gastroenterology;;  . ESOPHAGOGASTRODUODENOSCOPY (EGD) WITH PROPOFOL N/A 12/13/2014   Procedure: ESOPHAGOGASTRODUODENOSCOPY (EGD) WITH PROPOFOL;  Surgeon: Hulen Luster, MD;  Location: [facility] ENDOSCOPY;  Service: Gastroenterology;  Laterality: N/A;  . HERNIA REPAIR    . TONSILLECTOMY         Family History  Problem Relation Age of Onset  . Cirrhosis Mother     Social History   Tobacco Use  . Smoking status: Never  . Smokeless tobacco: Never  Substance Use Topics  . Alcohol use: Yes    Alcohol/week: 0.0 standard drinks     Comment: rarely  . Drug use: No    Home Medications Prior to Admission medications   Medication Sig Start Date End Date Taking? Authorizing Provider  allopurinol (ZYLOPRIM) 100 MG tablet Take 100 mg by mouth daily. 04/13/20  Yes [provider]  atorvastatin (LIPITOR) 20 MG tablet Take 20 mg by mouth every evening. 02/02/20  Yes [provider]  cloNIDine (CATAPRES) 0.1 MG tablet Take 0.1 mg by mouth 2 (two) times daily. 01/24/20  Yes [provider]  donepezil (ARICEPT) 5 MG tablet Take 5 mg by mouth daily. 02/02/20  Yes [provider]  hydrochlorothiazide (HYDRODIURIL) 12.5 MG tablet Take 12.5 mg by mouth daily. 02/02/20  Yes [provider]  levothyroxine (SYNTHROID) 75 MCG tablet Take 75 mcg by mouth daily. 02/02/20  Yes [provider]  metoprolol tartrate (LOPRESSOR) 25 MG tablet Take 37.5 mg by mouth daily.   Yes [provider]  pantoprazole (PROTONIX) 20 MG tablet Take 20 mg by mouth daily.   Yes [provider]  sertraline (ZOLOFT) 50 MG tablet Take 50 mg by mouth at bedtime. 02/29/20  Yes [provider]  spironolactone (ALDACTONE) 25 MG tablet Take 25 mg by mouth daily. 03/27/20  Yes [provider]  traZODone (DESYREL) 50 MG tablet Take 50 mg by mouth at bedtime. 04/20/20  Yes [provider]  amLODipine (NORVASC) 10 MG tablet Take 10 mg  by mouth daily.    [provider]  HYDROcodone-acetaminophen (NORCO/VICODIN) 5-325 MG tablet 1-2 tabs po q 8 hours prn Patient not taking: No sig reported 06/24/15   Norval Gable, MD  levofloxacin (LEVAQUIN) 500 MG tablet Take 1 tablet (500 mg total) by mouth daily. Patient not taking: No sig reported 06/24/15   Norval Gable, MD    Allergies    Patient has no known allergies.  Review of Systems   Review of Systems  Constitutional:  Negative for diaphoresis.  Respiratory:  Positive for choking. Negative for shortness of breath and stridor.    Gastrointestinal:  Positive for nausea and vomiting.  Musculoskeletal:        Chest soreness  Neurological:  Negative for dizziness, syncope and weakness.  All other systems reviewed and are negative.  Physical Exam Updated Vital Signs BP (!) 138/94 (BP Location: Right Arm)   Pulse 74   Temp 98.4 F (36.9 C) (Oral)   Resp 20   SpO2 97%   Physical Exam Vitals and nursing note reviewed.  Constitutional:      General: He is not in acute distress.    Appearance: He is well-developed. He is not diaphoretic.  HENT:     Head: Normocephalic and atraumatic.     Comments: Patient able to lie in a semireclined without noted difficulty.  He does appear uncomfortable, but not distressed. He is tolerating his secretions.  Mouth opening to least 3 finger widths.  No noted swelling within the mouth or posterior pharynx.  No noted foreign bodies. No noted swelling to the face or neck.    Mouth/Throat:     Mouth: Mucous membranes are moist.     Pharynx: Oropharynx is clear.  Eyes:     Conjunctiva/sclera: Conjunctivae normal.  Cardiovascular:     Rate and Rhythm: Normal rate and regular rhythm.     Pulses: Normal pulses.          Radial pulses are 2+ on the right side and 2+ on the left side.     Heart sounds: Normal heart sounds.  Pulmonary:     Effort: Pulmonary effort is normal. No respiratory distress.     Breath sounds: Normal breath sounds.  Chest:     Chest wall: Tenderness present.     Comments: Some tenderness over the sternum without swelling, color abnormality, deformity. Abdominal:     Palpations: Abdomen is soft.     Tenderness: There is no abdominal tenderness. There is no guarding.  Musculoskeletal:     Cervical back: Normal range of motion and neck supple.  Skin:    General: Skin is warm and dry.  Neurological:     Mental Status: He is alert.  Psychiatric:        Mood and Affect: Mood and affect normal.        Speech: Speech normal.        Behavior: Behavior  normal.    ED Results / Procedures / Treatments   Labs (all labs ordered are listed, but only abnormal results are displayed) Labs Reviewed  COMPREHENSIVE METABOLIC PANEL - Abnormal; Notable for the following components:      Result Value   CO2 21 (*)    Glucose, Bld 141 (*)    All other components within normal limits  CBC WITH DIFFERENTIAL/PLATELET - Abnormal; Notable for the following components:   Hemoglobin 17.1 (*)    All other components within normal limits  LIPASE, BLOOD    EKG None  Radiology DG Neck Soft Tissue  Result Date: 07/02/2020 CLINICAL DATA:  Patient choked on a piece of chicken. Chicken still stuck in throat. Similar episode 3 weeks ago. EXAM: NECK SOFT TISSUES - 1+ VIEW COMPARISON:  CT neck 10/23/2018 FINDINGS: There is no evidence of retropharyngeal soft tissue swelling or epiglottic enlargement. The cervical airway is unremarkable and no radio-opaque foreign body identified. IMPRESSION: No radiopaque foreign bodies are demonstrated. Electronically Signed   By: Lucienne Capers M.D.   On: 07/02/2020 20:06   DG Abdomen Acute W/Chest  Result Date: 07/02/2020 CLINICAL DATA:  Patient choked on a piece of chicken, still stuck in throat. Similar episode about 3 weeks ago. EXAM: DG ABDOMEN ACUTE WITH 1 VIEW CHEST COMPARISON:  Esophagram 09/02/2018 FINDINGS: Shallow inspiration with linear atelectasis in the lung bases. Heart size and pulmonary vascularity are normal. No airspace disease or consolidation in the lungs. No pleural effusions. No pneumothorax. Calcified and tortuous aorta. Gas and stool scattered throughout the colon. No small or large bowel distention. No free intra-abdominal air. No abnormal air-fluid levels. No radiopaque stones. Visualized bones and soft tissue contours appear intact. No radiopaque foreign bodies are demonstrated. IMPRESSION: Shallow inspiration with linear atelectasis in the lung bases. Normal nonobstructive bowel gas pattern. No radiopaque  foreign bodies demonstrated. Electronically Signed   By: Lucienne Capers M.D.   On: 07/02/2020 20:04    Procedures Procedures   Medications Ordered in ED Medications  glucagon (human recombinant) (GLUCAGEN) injection 1 mg (1 mg Intravenous Given 07/02/20 1856)  ondansetron (ZOFRAN) injection 4 mg (4 mg Intravenous Given 07/02/20 1832)  sodium chloride 0.9 % bolus 1,000 mL (0 mLs Intravenous Stopped 07/02/20 2121)  alum & mag hydroxide-simeth (MAALOX/MYLANTA) 200-200-20 MG/5ML suspension 30 mL (30 mLs Oral Given 07/02/20 2119)    And  lidocaine (XYLOCAINE) 2 % viscous mouth solution 15 mL (15 mLs Oral Given 07/02/20 2119)    ED Course  I have reviewed the triage vital signs and the nursing notes.  Pertinent labs & imaging results that were available during my care of the patient were reviewed by me and considered in my medical decision making (see chart for details).  Clinical Course as of 07/03/20 2044  Sat Jul 02, 2020  1915 Patient states he is now feeling much better and able to readily tolerate fluids without regurgitation or vomiting. [SJ]  2113 SpO2: 93 % I actually consistently note patient's SPO2 at >97% on room air. [SJ]    Clinical Course User Index [SJ] Adrien Dietzman, Helane Gunther, PA-C   MDM Rules/Calculators/A&P                          Patient presents with suspected food bolus impaction.  Initially, he was unable to tolerate oral fluids. Patient is nontoxic appearing, afebrile, not tachycardic, not tachypneic, not hypotensive, maintains excellent SPO2 on room air.  I have reviewed the patient's chart to obtain more information.   I reviewed and interpreted the patient's labs and radiological studies. Patient had the same issue couple weeks ago and apparently had esophageal stricture with subsequent dilation a couple years ago. He was advised that until he can follow-up with GI in the office, he will need to eat only soft, pured, and liquid foods.  Prior to discharge he is readily  tolerating oral fluids without significant pain.  He ambulates without shortness of breath for any worsening symptoms.  The patient was given instructions for home care as well as return  precautions. Patient voices understanding of these instructions, accepts the plan, and is comfortable with discharge.   Findings and plan of care discussed with attending physician, Dene Gentry, MD. Dr. Francia Greaves personally evaluated and examined this patient.   Procedure Laterality Date  . ESOPHAGEAL DILATION 10/2018  done at [facility]  . ESOPHAGOGASTRODUODENOSCOPY 10/2018  done at [facility]   .  Vitals:   07/02/20 1930 07/02/20 2000 07/02/20 2100 07/02/20 2122  BP: 130/90 129/80 140/84 140/84  Pulse: 86 98 90 92  Resp: 17 15 16 18   Temp:    98.6 F (37 C)  TempSrc:    Oral  SpO2: 95% 92% 93% 100%    Final Clinical Impression(s) / ED Diagnoses Final diagnoses:  Food impaction of esophagus, initial encounter    Rx / DC Orders ED Discharge Orders     None        Layla Maw 07/03/20 2044    Valarie Merino, MD 07/04/20 [phone]

## 2020-07-02 NOTE — Discharge Instructions (Signed)
  We recommend a diet of soft, pured, or liquid foods until told otherwise by the gastroenterologist.  Follow-up with the gastroenterologist on this matter.  Call to make an appointment to be seen as soon as possible.  Return to the emergency department for fever, choking, difficulty swallowing, difficulty breathing, persistent chest pain, worsening abdominal pain, or any other major concerns.

## 2020-07-02 NOTE — ED Triage Notes (Signed)
Pt reports he was eating and choked on a piece of chicken. The chicken is still stuck in his throat. Pt reports a similar episode about 3 weeks ago. No trouble breathing at this time.

## 2021-05-11 ENCOUNTER — Other Ambulatory Visit: Payer: Self-pay | Admitting: Student

## 2021-05-11 DIAGNOSIS — F0280 Dementia in other diseases classified elsewhere without behavioral disturbance: Secondary | ICD-10-CM

## 2021-05-22 ENCOUNTER — Ambulatory Visit
Admission: RE | Admit: 2021-05-22 | Discharge: 2021-05-22 | Disposition: A | Discharge status: Home / Self Care | Payer: Managed Care, Other (non HMO) | Source: Ambulatory Visit | Attending: Student | Admitting: Student

## 2021-05-22 DIAGNOSIS — G1220 Motor neuron disease, unspecified: Secondary | ICD-10-CM | POA: Insufficient documentation

## 2021-05-22 DIAGNOSIS — G3109 Other frontotemporal dementia: Secondary | ICD-10-CM | POA: Insufficient documentation

## 2021-05-22 DIAGNOSIS — F0280 Dementia in other diseases classified elsewhere without behavioral disturbance: Secondary | ICD-10-CM | POA: Insufficient documentation

## 2021-07-06 ENCOUNTER — Encounter (HOSPITAL_COMMUNITY): Payer: Self-pay

## 2021-07-06 ENCOUNTER — Other Ambulatory Visit: Payer: Self-pay | Admitting: Physician Assistant

## 2021-07-06 DIAGNOSIS — R6889 Other general symptoms and signs: Secondary | ICD-10-CM

## 2021-07-06 DIAGNOSIS — R079 Chest pain, unspecified: Secondary | ICD-10-CM

## 2021-07-06 DIAGNOSIS — R0609 Other forms of dyspnea: Secondary | ICD-10-CM

## 2021-07-06 DIAGNOSIS — E785 Hyperlipidemia, unspecified: Secondary | ICD-10-CM

## 2021-07-07 ENCOUNTER — Telehealth (HOSPITAL_COMMUNITY): Payer: Self-pay | Admitting: *Deleted

## 2021-07-07 NOTE — Telephone Encounter (Signed)
Reaching out to patient to offer assistance regarding upcoming cardiac imaging study; pt verbalizes understanding of appt date/time, parking situation and where to check in, pre-test NPO status and verified current allergies; name and call back number provided for further questions should they arise  Gordy Clement RN Navigator Cardiac Imaging [facility] [phone] office [phone] cell  Patient to take his daily medications and hold his HCTZ.

## 2021-07-10 ENCOUNTER — Ambulatory Visit
Admission: RE | Admit: 2021-07-10 | Discharge: 2021-07-10 | Disposition: A | Discharge status: Home / Self Care | Payer: Managed Care, Other (non HMO) | Source: Ambulatory Visit | Attending: Physician Assistant | Admitting: Physician Assistant

## 2021-07-10 DIAGNOSIS — R079 Chest pain, unspecified: Secondary | ICD-10-CM | POA: Insufficient documentation

## 2021-07-10 DIAGNOSIS — E785 Hyperlipidemia, unspecified: Secondary | ICD-10-CM | POA: Insufficient documentation

## 2021-07-10 DIAGNOSIS — I2510 Atherosclerotic heart disease of native coronary artery without angina pectoris: Secondary | ICD-10-CM

## 2021-07-10 DIAGNOSIS — R0609 Other forms of dyspnea: Secondary | ICD-10-CM | POA: Insufficient documentation

## 2021-07-10 DIAGNOSIS — R6889 Other general symptoms and signs: Secondary | ICD-10-CM | POA: Insufficient documentation

## 2021-07-10 LAB — POCT I-STAT CREATININE: Creatinine, Ser: 1.1 mg/dL (ref 0.61–1.24)

## 2021-07-10 MED ORDER — METOPROLOL TARTRATE 5 MG/5ML IV SOLN
10.0000 mg | Freq: Once | INTRAVENOUS | Status: AC
  Administered 2021-07-10: 10 mg via INTRAVENOUS
  Filled 2021-07-10: qty 10

## 2021-07-10 MED ORDER — METOPROLOL TARTRATE 5 MG/5ML IV SOLN
INTRAVENOUS | Status: AC
  Filled 2021-07-10: qty 10

## 2021-07-10 MED ORDER — METOPROLOL TARTRATE 5 MG/5ML IV SOLN
10.0000 mg | Freq: Once | INTRAVENOUS | Status: DC
  Filled 2021-07-10: qty 10

## 2021-07-10 MED ORDER — NITROGLYCERIN 0.4 MG SL SUBL
0.8000 mg | SUBLINGUAL_TABLET | Freq: Once | SUBLINGUAL | Status: AC
  Filled 2021-07-10: qty 25

## 2021-07-10 MED ORDER — NITROGLYCERIN 0.4 MG SL SUBL
SUBLINGUAL_TABLET | SUBLINGUAL | Status: AC
  Administered 2021-07-10: 0.8 mg via SUBLINGUAL
  Filled 2021-07-10: qty 2

## 2021-07-10 MED ORDER — IOHEXOL 350 MG/ML SOLN
100.0000 mL | Freq: Once | INTRAVENOUS | Status: AC | PRN
  Administered 2021-07-10: 100 mL via INTRAVENOUS

## 2021-07-10 NOTE — Progress Notes (Signed)
Patient tolerated procedure well. Ambulate w/o difficulty. Denies any lightheadedness or being dizzy. Pt denies any pain at this time. Sitting in chair, drinking water provided. Pt is encouraged to drink additional water throughout the day and reason explained to patient. Patient verbalized understanding and all questions answered. ABC intact. No further needs at this time. Discharge from procedure area w/o issues.  

## 2021-07-14 ENCOUNTER — Emergency Department: Payer: Managed Care, Other (non HMO)

## 2021-07-14 ENCOUNTER — Inpatient Hospital Stay
Admission: EM | Admit: 2021-07-14 | Discharge: 2021-07-18 | DRG: 247 | Disposition: A | Discharge status: Home / Self Care | Payer: Managed Care, Other (non HMO) | Attending: Internal Medicine | Admitting: Internal Medicine

## 2021-07-14 ENCOUNTER — Other Ambulatory Visit: Payer: Self-pay

## 2021-07-14 DIAGNOSIS — F32A Depression, unspecified: Secondary | ICD-10-CM | POA: Diagnosis present

## 2021-07-14 DIAGNOSIS — F0390 Unspecified dementia without behavioral disturbance: Secondary | ICD-10-CM

## 2021-07-14 DIAGNOSIS — F0283 Dementia in other diseases classified elsewhere, unspecified severity, with mood disturbance: Secondary | ICD-10-CM | POA: Diagnosis present

## 2021-07-14 DIAGNOSIS — R079 Chest pain, unspecified: Secondary | ICD-10-CM | POA: Diagnosis not present

## 2021-07-14 DIAGNOSIS — I10 Essential (primary) hypertension: Secondary | ICD-10-CM | POA: Diagnosis present

## 2021-07-14 DIAGNOSIS — E785 Hyperlipidemia, unspecified: Secondary | ICD-10-CM

## 2021-07-14 DIAGNOSIS — K219 Gastro-esophageal reflux disease without esophagitis: Secondary | ICD-10-CM | POA: Diagnosis not present

## 2021-07-14 DIAGNOSIS — Z23 Encounter for immunization: Secondary | ICD-10-CM

## 2021-07-14 DIAGNOSIS — E876 Hypokalemia: Secondary | ICD-10-CM | POA: Diagnosis present

## 2021-07-14 DIAGNOSIS — E669 Obesity, unspecified: Secondary | ICD-10-CM | POA: Diagnosis present

## 2021-07-14 DIAGNOSIS — I25110 Atherosclerotic heart disease of native coronary artery with unstable angina pectoris: Secondary | ICD-10-CM | POA: Diagnosis not present

## 2021-07-14 DIAGNOSIS — M109 Gout, unspecified: Secondary | ICD-10-CM | POA: Diagnosis present

## 2021-07-14 DIAGNOSIS — E039 Hypothyroidism, unspecified: Secondary | ICD-10-CM | POA: Diagnosis not present

## 2021-07-14 DIAGNOSIS — Z79890 Hormone replacement therapy (postmenopausal): Secondary | ICD-10-CM

## 2021-07-14 DIAGNOSIS — Z6835 Body mass index (BMI) 35.0-35.9, adult: Secondary | ICD-10-CM

## 2021-07-14 DIAGNOSIS — I200 Unstable angina: Secondary | ICD-10-CM | POA: Diagnosis present

## 2021-07-14 DIAGNOSIS — Z79899 Other long term (current) drug therapy: Secondary | ICD-10-CM

## 2021-07-14 DIAGNOSIS — E7800 Pure hypercholesterolemia, unspecified: Secondary | ICD-10-CM | POA: Diagnosis present

## 2021-07-14 DIAGNOSIS — Z7982 Long term (current) use of aspirin: Secondary | ICD-10-CM

## 2021-07-14 DIAGNOSIS — Z8673 Personal history of transient ischemic attack (TIA), and cerebral infarction without residual deficits: Secondary | ICD-10-CM

## 2021-07-14 HISTORY — DX: Unspecified dementia, unspecified severity, without behavioral disturbance, psychotic disturbance, mood disturbance, and anxiety: F03.90

## 2021-07-14 LAB — BASIC METABOLIC PANEL
Anion gap: 6 (ref 5–15)
BUN: 18 mg/dL (ref 8–23)
CO2: 23 mmol/L (ref 22–32)
Calcium: 9.2 mg/dL (ref 8.9–10.3)
Chloride: 109 mmol/L (ref 98–111)
Creatinine, Ser: 1.1 mg/dL (ref 0.61–1.24)
GFR, Estimated: >60 mL/min (ref >60)
Glucose, Bld: 121 mg/dL — ABNORMAL HIGH (ref 70–99)
Potassium: 3.7 mmol/L (ref 3.5–5.1)
Sodium: 138 mmol/L (ref 135–145)

## 2021-07-14 LAB — CBC
HCT: 49.7 % (ref 39.0–52.0)
Hemoglobin: 17.7 g/dL — ABNORMAL HIGH (ref 13.0–17.0)
MCH: 30.1 pg (ref 26.0–34.0)
MCHC: 35.6 g/dL (ref 30.0–36.0)
MCV: 84.5 fL (ref 80.0–100.0)
Platelets: 238 10*3/uL (ref 150–400)
RBC: 5.88 MIL/uL — ABNORMAL HIGH (ref 4.22–5.81)
RDW: 13.1 % (ref 11.5–15.5)
WBC: 8.4 10*3/uL (ref 4.0–10.5)
nRBC: 0 % (ref 0.0–0.2)

## 2021-07-14 LAB — TROPONIN I (HIGH SENSITIVITY)
Troponin I (High Sensitivity): 6 ng/L (ref <18)
Troponin I (High Sensitivity): 7 ng/L (ref <18)

## 2021-07-14 MED ORDER — MORPHINE SULFATE (PF) 2 MG/ML IV SOLN: 2.0000 mg | INTRAVENOUS | Status: DC | PRN

## 2021-07-14 MED ORDER — ASPIRIN 81 MG PO CHEW
324.0000 mg | CHEWABLE_TABLET | Freq: Once | ORAL | Status: AC
Start: 2021-07-14 — End: 2021-07-14
  Administered 2021-07-14: 324 mg via ORAL
  Filled 2021-07-14: qty 4

## 2021-07-14 MED ORDER — PANTOPRAZOLE SODIUM 40 MG PO TBEC
40.0000 mg | DELAYED_RELEASE_TABLET | Freq: Every day | ORAL | Status: DC
  Administered 2021-07-15 – 2021-07-18 (×3): 40 mg via ORAL
  Filled 2021-07-14 (×3): qty 1

## 2021-07-14 MED ORDER — MAGNESIUM HYDROXIDE 400 MG/5ML PO SUSP: 30.0000 mL | Freq: Every day | ORAL | Status: DC | PRN

## 2021-07-14 MED ORDER — LEVOTHYROXINE SODIUM 50 MCG PO TABS
75.0000 ug | ORAL_TABLET | Freq: Every day | ORAL | Status: DC
  Administered 2021-07-15 – 2021-07-18 (×4): 75 ug via ORAL
  Filled 2021-07-14 (×4): qty 1

## 2021-07-14 MED ORDER — SERTRALINE HCL 50 MG PO TABS
50.0000 mg | ORAL_TABLET | Freq: Every day | ORAL | Status: DC
  Administered 2021-07-14 – 2021-07-17 (×4): 50 mg via ORAL
  Filled 2021-07-14 (×4): qty 1

## 2021-07-14 MED ORDER — HYDRALAZINE HCL 20 MG/ML IJ SOLN
10.0000 mg | Freq: Once | INTRAMUSCULAR | Status: AC
  Administered 2021-07-15: 10 mg via INTRAVENOUS
  Filled 2021-07-14: qty 1

## 2021-07-14 MED ORDER — NITROGLYCERIN 0.4 MG SL SUBL: 0.4000 mg | SUBLINGUAL_TABLET | SUBLINGUAL | Status: DC | PRN

## 2021-07-14 MED ORDER — ONDANSETRON HCL 4 MG/2ML IJ SOLN: 4.0000 mg | Freq: Four times a day (QID) | INTRAMUSCULAR | Status: DC | PRN

## 2021-07-14 MED ORDER — IRBESARTAN 75 MG PO TABS
37.5000 mg | ORAL_TABLET | Freq: Every day | ORAL | Status: DC
  Administered 2021-07-15 – 2021-07-18 (×3): 37.5 mg via ORAL
  Filled 2021-07-14 (×5): qty 0.5

## 2021-07-14 MED ORDER — ONDANSETRON HCL 4 MG PO TABS: 4.0000 mg | ORAL_TABLET | Freq: Four times a day (QID) | ORAL | Status: DC | PRN

## 2021-07-14 MED ORDER — ATORVASTATIN CALCIUM 20 MG PO TABS
20.0000 mg | ORAL_TABLET | Freq: Every evening | ORAL | Status: DC
  Administered 2021-07-14 – 2021-07-17 (×4): 20 mg via ORAL
  Filled 2021-07-14 (×4): qty 1

## 2021-07-14 MED ORDER — HYDROCHLOROTHIAZIDE 12.5 MG PO TABS
12.5000 mg | ORAL_TABLET | Freq: Every day | ORAL | Status: DC
  Administered 2021-07-15 – 2021-07-18 (×3): 12.5 mg via ORAL
  Filled 2021-07-14 (×3): qty 1

## 2021-07-14 MED ORDER — ENOXAPARIN SODIUM 60 MG/0.6ML IJ SOSY
0.5000 mg/kg | PREFILLED_SYRINGE | INTRAMUSCULAR | Status: DC
  Administered 2021-07-15 – 2021-07-18 (×3): 60 mg via SUBCUTANEOUS
  Filled 2021-07-14 (×3): qty 0.6

## 2021-07-14 MED ORDER — ALLOPURINOL 100 MG PO TABS
100.0000 mg | ORAL_TABLET | Freq: Every day | ORAL | Status: DC
  Administered 2021-07-15 – 2021-07-18 (×3): 100 mg via ORAL
  Filled 2021-07-14 (×4): qty 1

## 2021-07-14 MED ORDER — METOPROLOL SUCCINATE ER 50 MG PO TB24
50.0000 mg | ORAL_TABLET | Freq: Two times a day (BID) | ORAL | Status: DC
  Administered 2021-07-15 – 2021-07-18 (×6): 50 mg via ORAL
  Filled 2021-07-14 (×6): qty 1

## 2021-07-14 MED ORDER — TRAZODONE HCL 50 MG PO TABS
25.0000 mg | ORAL_TABLET | Freq: Every evening | ORAL | Status: DC | PRN
  Administered 2021-07-15 – 2021-07-17 (×3): 25 mg via ORAL
  Filled 2021-07-14 (×3): qty 1

## 2021-07-14 MED ORDER — ACETAMINOPHEN 325 MG PO TABS
650.0000 mg | ORAL_TABLET | Freq: Four times a day (QID) | ORAL | Status: DC | PRN
  Administered 2021-07-15 – 2021-07-17 (×3): 650 mg via ORAL
  Filled 2021-07-14 (×2): qty 2

## 2021-07-14 MED ORDER — ASPIRIN 81 MG PO TBEC
81.0000 mg | DELAYED_RELEASE_TABLET | Freq: Every day | ORAL | Status: DC
  Administered 2021-07-15 – 2021-07-18 (×3): 81 mg via ORAL
  Filled 2021-07-14 (×3): qty 1

## 2021-07-14 MED ORDER — METOPROLOL SUCCINATE ER 50 MG PO TB24
50.0000 mg | ORAL_TABLET | Freq: Every day | ORAL | Status: DC
  Administered 2021-07-14: 50 mg via ORAL
  Filled 2021-07-14: qty 1

## 2021-07-14 MED ORDER — HYDRALAZINE HCL 20 MG/ML IJ SOLN
10.0000 mg | Freq: Four times a day (QID) | INTRAMUSCULAR | Status: AC | PRN
  Administered 2021-07-15: 10 mg via INTRAVENOUS
  Filled 2021-07-14: qty 1

## 2021-07-14 MED ORDER — ACETAMINOPHEN 650 MG RE SUPP: 650.0000 mg | Freq: Four times a day (QID) | RECTAL | Status: DC | PRN

## 2021-07-14 MED ORDER — SODIUM CHLORIDE 0.9 % IV SOLN: INTRAVENOUS | Status: DC

## 2021-07-14 MED ORDER — DONEPEZIL HCL 5 MG PO TABS
5.0000 mg | ORAL_TABLET | Freq: Every day | ORAL | Status: DC
  Administered 2021-07-15 – 2021-07-18 (×3): 5 mg via ORAL
  Filled 2021-07-14 (×4): qty 1

## 2021-07-14 NOTE — ED Provider Notes (Signed)
[facility] Provider Note    Event Date/Time   First MD Initiated Contact with Patient 07/14/21 2030     (approximate)   History   Chest Pain   HPI  Ian Allen is a 66 y.o. male past medical history of frontotemporal dementia prior CVA hypertension hyperlipidemia presents with chest pain.  Patient notes that he has had worsening chest pain over the last 6 months.  Typically occurs with exertion including walking going up stairs.  Has been worsening over the last 30 days.  He follows with cardiology.  Last cardiology note his 2D echo showed normal LV function with LVH he underwent a Myoview test which showed mild inferior scar without ischemia, he had a coronary CTA on 7/10 with significant plaque greater than 70% in the mid LAD as well as 25 to 49% RCA.  Cardiac cath is recommended and he is scheduled for this in 6 days.  He presents today because of worsening chest pain occurred after he walked up the stairs endorses pressure-like sensation in the mid chest that does not radiate is not associate with diaphoresis or nausea.  Has persisted since this time.  Tells me that its not atypical for him to have pain that lasted as long.  Was not relieved with nitro.  Does have some mild dyspnea.  Chest pain feels similar to prior episodes.  Has never had documented heart attack.    Past Medical History:  Diagnosis Date   Dementia ([place])    GERD (gastroesophageal reflux disease)    Hyperlipidemia    Hypertension    Orthopnea     Patient Active Problem List   Diagnosis Date Noted   Chest pain 07/14/2021     Physical Exam  Triage Vital Signs: ED Triage Vitals  Enc Vitals Group     BP 07/14/21 1526 (!) 135/112     Pulse Rate 07/14/21 1526 73     Resp 07/14/21 1526 20     Temp 07/14/21 1526 98 F (36.7 C)     Temp Source 07/14/21 1526 Oral     SpO2 07/14/21 1526 95 %     Weight 07/14/21 1523 261 lb (118.4 kg)     Height 07/14/21 1523 6' (1.829 m)     Head  Circumference --      Peak Flow --      Pain Score 07/14/21 1523 3     Pain Loc --      Pain Edu? --      Excl. in [place]? --     Most recent vital signs: Vitals:   07/14/21 2211 07/14/21 2343  BP: (!) 183/116 (!) 185/94  Pulse: (!) 58 (!) 50  Resp:  19  Temp:    SpO2:  96%     General: Awake, no distress.  CV:  Good peripheral perfusion.  Resp:  Normal effort.  Abd:  No distention.  Neuro:             Awake, Alert, Oriented x 3  Other:     ED Results / Procedures / Treatments  Labs (all labs ordered are listed, but only abnormal results are displayed) Labs Reviewed  BASIC METABOLIC PANEL - Abnormal; Notable for the following components:      Result Value   Glucose, Bld 121 (*)    All other components within normal limits  CBC - Abnormal; Notable for the following components:   RBC 5.88 (*)    Hemoglobin 17.7 (*)  All other components within normal limits  HIV ANTIBODY (ROUTINE TESTING W REFLEX)  BASIC METABOLIC PANEL  CBC  TROPONIN I (HIGH SENSITIVITY)  TROPONIN I (HIGH SENSITIVITY)     EKG  EKG interpretation performed by myself: NSR, nml axis, nml intervals, no acute ischemic changes    RADIOLOGY I reviewed and interpreted the CXR which does not show any acute cardiopulmonary process    PROCEDURES:  Critical Care performed: No  .1-3 Lead EKG Interpretation  Performed by: Rada Hay, MD Authorized by: Rada Hay, MD     Interpretation: normal     ECG rate assessment: normal     Rhythm: sinus rhythm     Ectopy: none     Conduction: normal     The patient is on the cardiac monitor to evaluate for evidence of arrhythmia and/or significant heart rate changes.   MEDICATIONS ORDERED IN ED: Medications  nitroGLYCERIN (NITROSTAT) SL tablet 0.4 mg (has no administration in time range)  allopurinol (ZYLOPRIM) tablet 100 mg (has no administration in time range)  aspirin EC tablet 81 mg (has no administration in time range)   atorvastatin (LIPITOR) tablet 20 mg (20 mg Oral Given 07/14/21 2344)  hydrochlorothiazide (HYDRODIURIL) tablet 12.5 mg (has no administration in time range)  metoprolol succinate (TOPROL-XL) 24 hr tablet 50 mg (has no administration in time range)  irbesartan (AVAPRO) tablet 37.5 mg (has no administration in time range)  donepezil (ARICEPT) tablet 5 mg (has no administration in time range)  sertraline (ZOLOFT) tablet 50 mg (50 mg Oral Given 07/14/21 2344)  levothyroxine (SYNTHROID) tablet 75 mcg (has no administration in time range)  pantoprazole (PROTONIX) EC tablet 40 mg (has no administration in time range)  enoxaparin (LOVENOX) injection 60 mg (has no administration in time range)  0.9 %  sodium chloride infusion (has no administration in time range)  acetaminophen (TYLENOL) tablet 650 mg (has no administration in time range)    Or  acetaminophen (TYLENOL) suppository 650 mg (has no administration in time range)  traZODone (DESYREL) tablet 25 mg (has no administration in time range)  magnesium hydroxide (MILK OF MAGNESIA) suspension 30 mL (has no administration in time range)  ondansetron (ZOFRAN) tablet 4 mg (has no administration in time range)    Or  ondansetron (ZOFRAN) injection 4 mg (has no administration in time range)  morphine (PF) 2 MG/ML injection 2 mg (has no administration in time range)  hydrALAZINE (APRESOLINE) injection 10 mg (has no administration in time range)  hydrALAZINE (APRESOLINE) injection 10 mg (has no administration in time range)  aspirin chewable tablet 324 mg (324 mg Oral Given 07/14/21 2210)     IMPRESSION / MDM / ASSESSMENT AND PLAN / ED COURSE  I reviewed the triage vital signs and the nursing notes.                              Patient's presentation is most consistent with acute presentation with potential threat to life or bodily function.  Differential diagnosis includes, but is not limited to, stable angina, NSTEMI, GERD, musculoskeletal,  PE  The patient is a 66 year old with multiple cardiac risk factors and known coronary disease based on coronary CTA recently performed presents with exertional chest pain.  Symptoms started today while he was walking up a flight of stairs and have continued.  Pain is typical of his usual pain but does feel more intense which is why he came to the  ED.  He has been having ongoing episodes for about 6 months.  Recent coronary CTA showed 70% lesion in the LAD and he is scheduled for cath in several days.  Blood pressure is elevated heart rate is mildly low as well.  He looks well on exam cardiopulmonary exam reassuring EKG is nonischemic and troponins x2 are negative however with these increasing exertional symptoms with known coronary disease I am concerned for unstable angina clinically.  We will give aspirin nitro and patient's p.o. antihypertensives.  I discussed with the hospitalist who agrees to admit the patient.  Did discuss with the patient that while he will likely see cardiology cannot guarantee that they will perform a cath and he was still agreeable to admission.       FINAL CLINICAL IMPRESSION(S) / ED DIAGNOSES   Final diagnoses:  Chest pain, unspecified type     Rx / DC Orders   ED Discharge Orders     None        Note:  This document was prepared using Dragon voice recognition software and may include unintentional dictation errors.   Rada Hay, MD 07/15/21 Dyann Kief

## 2021-07-14 NOTE — Progress Notes (Signed)
PHARMACIST - PHYSICIAN COMMUNICATION  CONCERNING:  Enoxaparin (Lovenox) for DVT Prophylaxis    RECOMMENDATION: Patient was prescribed enoxaprin '40mg'$  q24 hours for VTE prophylaxis.   Filed Weights   07/14/21 1523  Weight: 118.4 kg (261 lb)    Body mass index is 35.4 kg/m.  Estimated Creatinine Clearance: 90.1 mL/min (by C-G formula based on SCr of 1.1 mg/dL).   Based on [place] patient is candidate for enoxaparin 0.'5mg'$ /kg TBW SQ every 24 hours based on BMI being >30.  DESCRIPTION: Pharmacy has adjusted enoxaparin dose per [facility] policy.  Patient is now receiving enoxaparin 0.5 mg/kg every 24 hours   Renda Rolls, PharmD, [facility] 07/14/2021 11:16 PM

## 2021-07-14 NOTE — ED Triage Notes (Signed)
Pt in from home due to intermittent CP and SOB; reports has appointment coming up with Cards but CP got too bad; center of chest is a "pressure" sensation.

## 2021-07-14 NOTE — H&P (Incomplete)
[place]   PATIENT NAME: Ian Allen    MR#:  [id number]  DATE OF BIRTH:  Sep 27, 1955  DATE OF ADMISSION:  07/14/2021  PRIMARY CARE PHYSICIAN: [facility]   Patient is coming from: Home  REQUESTING/REFERRING PHYSICIAN: Rada Hay, MD  CHIEF COMPLAINT:   Chief Complaint  Patient presents with   Chest Pain    HISTORY OF PRESENT ILLNESS:  Ian Allen is a 66 y.o. Caucasian male with medical history significant for dementia, GERD, hypertension and dyslipidemia, who presented to the emergency room with acute onset of midsternal chest pain felt as pressure and graded 3/10 in severity with associated dyspnea and headache without nausea or vomiting or diaphoresis.  He denied any cough or wheezing or hemoptysis.  No bleeding diathesis.  No leg pain or edema or recent travels or surgeries.  The patient had a recent coronary CT revealing significant coronary artery disease for which she is scheduled to have a cardiac catheterization on 7/20 with Dr. Kerman Passey.  No dysuria, oliguria or hematuria or flank pain.  ED Course: Upon presentation to the emergency room, BP was 135/112 with otherwise normal vital signs.  Later BP was 182/98.  BMP was within normal.  CBC showed hemoconcentration.  High-sensitivity troponin I was 6 and later 7. EKG as reviewed by me : EKG showed normal sinus rhythm with a rate of 75 Imaging: Two-view chest x-ray showed no acute cardiopulmonary disease.  Recent coronary CTA on 7/10 revealed: 1. Coronary calcium score of 1149. This was 95th percentile for age and sex matched control.   2. Normal coronary origin with right dominance.   3. Calcified plaque causing severe stenosis (>70%) in the mid LAD.   4. Calcified plaque causing mild proximal RCA stenosis (25-49%).   5. CAD-RADS 4 Severe stenosis. (70-99% or > 50% left main). Cardiac catheterization is recommended. Consider symptom-guided anti-ischemic pharmacotherapy as well as risk  factor modification per guideline directed care.   6.  Image quality degraded by obesity related artifacts.    The patient was given 4 baby aspirin, 50 mg of p.o. Toprol-XL and 0.4 mg of sublingual nitroglycerin.  He will be admitted to an observation cardiac telemetry bed for further evaluation and management. PAST MEDICAL HISTORY:   Past Medical History:  Diagnosis Date   Dementia ([place])    GERD (gastroesophageal reflux disease)    Hyperlipidemia    Hypertension    Orthopnea     PAST SURGICAL HISTORY:   Past Surgical History:  Procedure Laterality Date   COLONOSCOPY WITH PROPOFOL  12/13/2014   Procedure: COLONOSCOPY WITH PROPOFOL;  Surgeon: Hulen Luster, MD;  Location: [facility] ENDOSCOPY;  Service: Gastroenterology;;   ESOPHAGOGASTRODUODENOSCOPY (EGD) WITH PROPOFOL N/A 12/13/2014   Procedure: ESOPHAGOGASTRODUODENOSCOPY (EGD) WITH PROPOFOL;  Surgeon: Hulen Luster, MD;  Location: [facility] ENDOSCOPY;  Service: Gastroenterology;  Laterality: N/A;   HERNIA REPAIR     TONSILLECTOMY      SOCIAL HISTORY:   Social History   Tobacco Use   Smoking status: Never   Smokeless tobacco: Never  Substance Use Topics   Alcohol use: Yes    Alcohol/week: 0.0 standard drinks of alcohol    Comment: rarely    FAMILY HISTORY:   Family History  Problem Relation Age of Onset   Cirrhosis Mother     DRUG ALLERGIES:  No Known Allergies  REVIEW OF SYSTEMS:   ROS As per history of present illness. All pertinent systems were reviewed above. Constitutional,  HEENT, cardiovascular, respiratory, GI, GU, musculoskeletal, neuro, psychiatric, endocrine, integumentary and hematologic systems were reviewed and are otherwise negative/unremarkable except for positive findings mentioned above in the HPI.   MEDICATIONS AT HOME:   Prior to Admission medications   Medication Sig Start Date End Date Taking? Authorizing Provider  allopurinol (ZYLOPRIM) 100 MG tablet Take 100 mg by mouth daily. 04/13/20  Yes [provider]  aspirin EC 81 MG tablet Take 1 tablet by mouth daily. 11/04/14  Yes [provider]  atorvastatin (LIPITOR) 20 MG tablet Take 20 mg by mouth every evening. 02/02/20  Yes [provider]  donepezil (ARICEPT) 5 MG tablet Take 5 mg by mouth daily. 02/02/20  Yes [provider]  hydrochlorothiazide (HYDRODIURIL) 12.5 MG tablet Take 12.5 mg by mouth daily. 02/02/20  Yes [provider]  levothyroxine (SYNTHROID) 75 MCG tablet Take 75 mcg by mouth daily. 02/02/20  Yes [provider]  metoprolol succinate (TOPROL-XL) 50 MG 24 hr tablet Take 50 mg by mouth 2 (two) times daily. 06/13/21  Yes [provider]  olmesartan (BENICAR) 20 MG tablet Take 20 mg by mouth 2 (two) times daily. 05/03/21  Yes [provider]  omeprazole (PRILOSEC) 40 MG capsule Take 40 mg by mouth daily. 05/15/21  Yes [provider]  sertraline (ZOLOFT) 50 MG tablet Take 50 mg by mouth at bedtime. 02/29/20  Yes [provider]  cloNIDine (CATAPRES) 0.1 MG tablet Take 0.1 mg by mouth 2 (two) times daily. Patient not taking: Reported on 07/14/2021 01/24/20   [provider]  colchicine 0.6 MG tablet Take 0.6 mg by mouth daily. Patient not taking: Reported on 07/14/2021 05/21/21   [provider]  nitroGLYCERIN (NITROSTAT) 0.4 MG SL tablet Place under the tongue. 07/14/21   [provider]  spironolactone (ALDACTONE) 25 MG tablet Take 25 mg by mouth daily. Patient not taking: Reported on 07/14/2021 03/27/20   [provider]      VITAL SIGNS:  Blood pressure (!) 185/94, pulse (!) 50, temperature 98 F (36.7 C), temperature source Oral, resp. rate 19, height 6' (1.829 m), weight 118.4 kg, SpO2 96 %.  PHYSICAL EXAMINATION:  Physical Exam  GENERAL:  66 y.o.-year-old Caucasian male patient lying in the bed with no acute distress.  EYES: Pupils equal, round, reactive to light and accommodation. No scleral icterus.  Extraocular muscles intact.  HEENT: Head atraumatic, normocephalic. Oropharynx and nasopharynx clear.  NECK:  Supple, no jugular venous distention. No thyroid enlargement, no tenderness.  LUNGS: Normal breath sounds bilaterally, no wheezing, rales,rhonchi or crepitation. No use of accessory muscles of respiration.  CARDIOVASCULAR: Regular rate and rhythm, S1, S2 normal. No murmurs, rubs, or gallops.  ABDOMEN: Soft, nondistended, nontender. Bowel sounds present. No organomegaly or mass.  EXTREMITIES: No pedal edema, cyanosis, or clubbing.  NEUROLOGIC: Cranial nerves II through XII are intact. Muscle strength 5/5 in all extremities. Sensation intact. Gait not checked.  PSYCHIATRIC: The patient is alert and oriented x 3.  Normal affect and good eye contact. SKIN: No obvious rash, lesion, or ulcer.   LABORATORY PANEL:   CBC Recent Labs  Lab 07/14/21 1530  WBC 8.4  HGB 17.7*  HCT 49.7  PLT 238   ------------------------------------------------------------------------------------------------------------------  Chemistries  Recent Labs  Lab 07/14/21 1530  NA 138  K 3.7  CL 109  CO2 23  GLUCOSE 121*  BUN 18  CREATININE 1.10  CALCIUM 9.2   ------------------------------------------------------------------------------------------------------------------  Cardiac Enzymes No results for input(s): "TROPONINI" in the  last 168 hours. ------------------------------------------------------------------------------------------------------------------  RADIOLOGY:  DG Chest 2 View  Result Date: 07/14/2021 CLINICAL DATA:  Intermittent chest pain and shortness of breath EXAM: CHEST - 2 VIEW COMPARISON:  07/02/2020 FINDINGS: The heart size and mediastinal contours are within normal limits. Both lungs are clear. The visualized skeletal structures are unremarkable. IMPRESSION: No active cardiopulmonary disease. Electronically Signed   By: Randa Ngo M.D.   On: 07/14/2021 16:15       IMPRESSION AND PLAN:  Assessment and Plan: * Chest pain - The patient will be admitted to an observation cardiac telemetry bed. - We will follow serial troponins. - The patient will be placed on aspirin as well as as needed sublingual nitroglycerin and morphine sulfate for pain. - We will continue beta-blocker therapy. - We will continue statin therapy. - Adequate BP control will be pursued. - Cardiology consult will be obtained. - Dr. Nehemiah Massed was notified about the patient.  GERD without esophagitis - We will continue PPI therapy.  Depression - We will continue Zoloft  Hypothyroidism - We will continue Synthroid.  Dementia without behavioral disturbance ([place]) - We will continue Aricept.  Gout Continue allopurinol.  Dyslipidemia - We will continue statin therapy.  Essential hypertension - We will continue antihypertensives   DVT prophylaxis: Lovenox.  Advanced Care Planning:  Code Status: full code.  Family Communication:  The plan of care was discussed in details with the patient (and family). I answered all questions. The patient agreed to proceed with the above mentioned plan. Further management will depend upon hospital course. Disposition Plan: Back to previous home environment Consults called: Cardiology consult. All the records are reviewed and case discussed with ED provider.  Status is: Observation   I certify that at the time of admission, it is my clinical judgment that the patient will require hospital care extending less than 2 midnights.                            Dispo: The patient is from: Home              Anticipated d/c is to: Home              Patient currently is not medically stable to d/c.              Difficult to place patient: No  Christel Mormon M.D on 07/15/2021 at 2:03 AM  [facility]   From 7 PM-7 AM, contact night-coverage [email or web address]  CC: Primary care physician; [facility]

## 2021-07-15 DIAGNOSIS — R0789 Other chest pain: Secondary | ICD-10-CM

## 2021-07-15 DIAGNOSIS — E785 Hyperlipidemia, unspecified: Secondary | ICD-10-CM

## 2021-07-15 DIAGNOSIS — E669 Obesity, unspecified: Secondary | ICD-10-CM | POA: Diagnosis not present

## 2021-07-15 DIAGNOSIS — K219 Gastro-esophageal reflux disease without esophagitis: Secondary | ICD-10-CM

## 2021-07-15 DIAGNOSIS — I10 Essential (primary) hypertension: Secondary | ICD-10-CM | POA: Diagnosis not present

## 2021-07-15 DIAGNOSIS — M109 Gout, unspecified: Secondary | ICD-10-CM

## 2021-07-15 DIAGNOSIS — F32A Depression, unspecified: Secondary | ICD-10-CM

## 2021-07-15 DIAGNOSIS — E039 Hypothyroidism, unspecified: Secondary | ICD-10-CM

## 2021-07-15 DIAGNOSIS — F0390 Unspecified dementia without behavioral disturbance: Secondary | ICD-10-CM

## 2021-07-15 LAB — BASIC METABOLIC PANEL
Anion gap: 5 (ref 5–15)
BUN: 17 mg/dL (ref 8–23)
CO2: 23 mmol/L (ref 22–32)
Calcium: 9.2 mg/dL (ref 8.9–10.3)
Chloride: 109 mmol/L (ref 98–111)
Creatinine, Ser: 0.99 mg/dL (ref 0.61–1.24)
GFR, Estimated: >60 mL/min (ref >60)
Glucose, Bld: 103 mg/dL — ABNORMAL HIGH (ref 70–99)
Potassium: 3.5 mmol/L (ref 3.5–5.1)
Sodium: 137 mmol/L (ref 135–145)

## 2021-07-15 LAB — CBC
HCT: 49.2 % (ref 39.0–52.0)
Hemoglobin: 17.5 g/dL — ABNORMAL HIGH (ref 13.0–17.0)
MCH: 29.9 pg (ref 26.0–34.0)
MCHC: 35.6 g/dL (ref 30.0–36.0)
MCV: 84.1 fL (ref 80.0–100.0)
Platelets: 228 10*3/uL (ref 150–400)
RBC: 5.85 MIL/uL — ABNORMAL HIGH (ref 4.22–5.81)
RDW: 13.2 % (ref 11.5–15.5)
WBC: 7.8 10*3/uL (ref 4.0–10.5)
nRBC: 0 % (ref 0.0–0.2)

## 2021-07-15 LAB — HIV ANTIBODY (ROUTINE TESTING W REFLEX): HIV Screen 4th Generation wRfx: NONREACTIVE

## 2021-07-15 MED ORDER — PNEUMOCOCCAL 20-VAL CONJ VACC 0.5 ML IM SUSY
0.5000 mL | PREFILLED_SYRINGE | INTRAMUSCULAR | Status: AC
  Administered 2021-07-16: 0.5 mL via INTRAMUSCULAR
  Filled 2021-07-15 (×2): qty 0.5

## 2021-07-15 MED ORDER — ISOSORBIDE MONONITRATE ER 30 MG PO TB24
30.0000 mg | ORAL_TABLET | Freq: Every day | ORAL | Status: DC
  Administered 2021-07-15 – 2021-07-18 (×3): 30 mg via ORAL
  Filled 2021-07-15 (×3): qty 1

## 2021-07-15 MED ORDER — AMLODIPINE BESYLATE 5 MG PO TABS
5.0000 mg | ORAL_TABLET | Freq: Every day | ORAL | Status: DC
  Administered 2021-07-15 – 2021-07-18 (×3): 5 mg via ORAL
  Filled 2021-07-15 (×3): qty 1

## 2021-07-15 NOTE — Assessment & Plan Note (Signed)
-   We will continue statin therapy. 

## 2021-07-15 NOTE — Assessment & Plan Note (Signed)
Continue allopurinol 

## 2021-07-15 NOTE — Assessment & Plan Note (Signed)
-   We will continue antihypertensives. 

## 2021-07-15 NOTE — Assessment & Plan Note (Signed)
-   The patient will be admitted to an observation cardiac telemetry bed. - We will follow serial troponins. - The patient will be placed on aspirin as well as as needed sublingual nitroglycerin and morphine sulfate for pain. - We will continue beta-blocker therapy. - We will continue statin therapy. - Adequate BP control will be pursued. - Cardiology consult will be obtained. - Dr. Nehemiah Massed was notified about the patient.

## 2021-07-15 NOTE — Consult Note (Signed)
[facility] Card[facility] Consultation Note  Patient ID: Ian Allen, MRN: [id number], DOB/AGE: 1955-05-20 66 y.o. Admit date: 07/14/2021   Date of Consult: 07/15/2021 Primary Physician: [facility] Primary Cardiologist: [facility]  Chief Complaint:  Chief Complaint  Patient presents with   Chest Pain   Reason for Consult:  Chest pain  HPI: 66 y.o. male with known hypertension hyperlipidemia on appropriate previous medication management who has had some progression of stable anginal equivalent with physical activity relieved by rest.  He has recently had a stress echocardiogram with abnormal ST depression of 2 mm in the inferior and anterior precordial leads with an anterior apical myocardial perfusion defect consistent with myocardial ischemia.  This improved after the patient stopped walking.  He has been placed on appropriate medication management including metoprolol high intensity cholesterol therapy aspirin at this time for which she has had continued mild amount of chest discomfort at this time but this is waxing and waning some.  The patient now has had an EKG showing normal sinus rhythm otherwise normal EKG with no evidence of changes.  Troponin level has been normal.  We have discussed further risk factor management as well as treatment of his current symptoms and the potential for cardiac catheterization.  He understands risk and benefits of cardiac catheterization and wishes to proceed although we have discussed that the patient is stable at this time and may be able to discharge home with cardiac catheterization on Monday.  Past Medical History:  Diagnosis Date   Dementia ([place])    GERD (gastroesophageal reflux disease)    Hyperlipidemia    Hypertension    Orthopnea       Surgical History:  Past Surgical History:  Procedure Laterality Date   COLONOSCOPY WITH PROPOFOL  12/13/2014   Procedure: COLONOSCOPY WITH PROPOFOL;  Surgeon: Hulen Luster, MD;  Location: [facility]  ENDOSCOPY;  Service: Gastroenterology;;   ESOPHAGOGASTRODUODENOSCOPY (EGD) WITH PROPOFOL N/A 12/13/2014   Procedure: ESOPHAGOGASTRODUODENOSCOPY (EGD) WITH PROPOFOL;  Surgeon: Hulen Luster, MD;  Location: [facility] ENDOSCOPY;  Service: Gastroenterology;  Laterality: N/A;   HERNIA REPAIR     TONSILLECTOMY       Home Meds: Prior to Admission medications   Medication Sig Start Date End Date Taking? Authorizing Provider  allopurinol (ZYLOPRIM) 100 MG tablet Take 100 mg by mouth daily. 04/13/20  Yes [provider]  aspirin EC 81 MG tablet Take 1 tablet by mouth daily. 11/04/14  Yes [provider]  atorvastatin (LIPITOR) 20 MG tablet Take 20 mg by mouth every evening. 02/02/20  Yes [provider]  donepezil (ARICEPT) 5 MG tablet Take 5 mg by mouth daily. 02/02/20  Yes [provider]  hydrochlorothiazide (HYDRODIURIL) 12.5 MG tablet Take 12.5 mg by mouth daily. 02/02/20  Yes [provider]  levothyroxine (SYNTHROID) 75 MCG tablet Take 75 mcg by mouth daily. 02/02/20  Yes [provider]  metoprolol succinate (TOPROL-XL) 50 MG 24 hr tablet Take 50 mg by mouth 2 (two) times daily. 06/13/21  Yes [provider]  olmesartan (BENICAR) 20 MG tablet Take 20 mg by mouth 2 (two) times daily. 05/03/21  Yes [provider]  omeprazole (PRILOSEC) 40 MG capsule Take 40 mg by mouth daily. 05/15/21  Yes [provider]  sertraline (ZOLOFT) 50 MG tablet Take 50 mg by mouth at bedtime. 02/29/20  Yes [provider]  cloNIDine (CATAPRES) 0.1 MG tablet Take 0.1 mg by mouth 2 (two) times daily. Patient not taking: Reported on 07/14/2021  01/24/20   [provider]  colchicine 0.6 MG tablet Take 0.6 mg by mouth daily. Patient not taking: Reported on 07/14/2021 05/21/21   [provider]  nitroGLYCERIN (NITROSTAT) 0.4 MG SL tablet Place under the tongue. 07/14/21   [provider]  spironolactone (ALDACTONE) 25 MG tablet Take 25  mg by mouth daily. Patient not taking: Reported on 07/14/2021 03/27/20   [provider]    Inpatient Medications:   allopurinol  100 mg Oral Daily   aspirin EC  81 mg Oral Daily   atorvastatin  20 mg Oral QPM   donepezil  5 mg Oral Daily   enoxaparin (LOVENOX) injection  0.5 mg/kg Subcutaneous Q24H   hydrochlorothiazide  12.5 mg Oral Daily   irbesartan  37.5 mg Oral Daily   levothyroxine  75 mcg Oral Q0600   metoprolol succinate  50 mg Oral BID   pantoprazole  40 mg Oral Daily   sertraline  50 mg Oral QHS    sodium chloride 100 mL/hr at 07/15/21 0093    Allergies: No Known Allergies  Social History   Socioeconomic History   Marital status: Married    Spouse name: Not on file   Number of children: Not on file   Years of education: Not on file   Highest education level: Not on file  Occupational History   Not on file  Tobacco Use   Smoking status: Never   Smokeless tobacco: Never  Substance and Sexual Activity   Alcohol use: Yes    Alcohol/week: 0.0 standard drinks of alcohol    Comment: rarely   Drug use: No   Sexual activity: Not on file  Other Topics Concern   Not on file  Social History Narrative   Not on file   Social Determinants of Health   Financial Resource Strain: Not on file  Food Insecurity: Not on file  Transportation Needs: Not on file  Physical Activity: Not on file  Stress: Not on file  Social Connections: Not on file  Intimate Partner Violence: Not on file     Family History  Problem Relation Age of Onset   Cirrhosis Mother      Review of Systems Positive for chest pain Negative for: General:  chills, fever, night sweats or weight changes.  Cardiovascular: PND orthopnea syncope dizziness  Dermatological skin lesions rashes Respiratory: Cough congestion Urologic: Frequent urination urination at night and hematuria Abdominal: negative for nausea, vomiting, diarrhea, bright red blood per rectum, melena, or  hematemesis Neurologic: negative for visual changes, and/or hearing changes  All other systems reviewed and are otherwise negative except as noted above.  Labs: No results for input(s): "CKTOTAL", "CKMB", "TROPONINI" in the last 72 hours. Lab Results  Component Value Date   WBC 7.8 07/15/2021   HGB 17.5 (H) 07/15/2021   HCT 49.2 07/15/2021   MCV 84.1 07/15/2021   PLT 228 07/15/2021    Recent Labs  Lab 07/15/21 0654  NA 137  K 3.5  CL 109  CO2 23  BUN 17  CREATININE 0.99  CALCIUM 9.2  GLUCOSE 103*   No results found for: "CHOL", "HDL", "LDLCALC", "TRIG" No results found for: "DDIMER"  Radiology/Studies:  DG Chest 2 View  Result Date: 07/14/2021 CLINICAL DATA:  Intermittent chest pain and shortness of breath EXAM: CHEST - 2 VIEW COMPARISON:  07/02/2020 FINDINGS: The heart size and mediastinal contours are within normal limits. Both lungs are clear. The visualized skeletal structures are unremarkable. IMPRESSION: No active cardiopulmonary disease.  Electronically Signed   By: Randa Ngo M.D.   On: 07/14/2021 16:15   CT CORONARY MORPH W/CTA COR W/SCORE W/CA W/CM &/OR WO/CM  Addendum Date: 07/10/2021   ADDENDUM REPORT: 07/10/2021 16:25 EXAM: OVER-READ INTERPRETATION  CT CHEST The following report is an over-read performed by radiologist Dr. Maudry Diego [facility] on 07/10/2021. This over-read does not include interpretation of cardiac or coronary anatomy or pathology. The cardiac/coronary CTA interpretation by the cardiologist is attached. COMPARISON:  Chest CT dated October 22, 2020 FINDINGS: Vascular: Normal heart size. Trace pericardial fluid. No suspicious filling defects of the central pulmonary arteries. Normal caliber thoracic aorta with mild atherosclerotic disease. Mediastinum/Nodes: Small hiatal hernia. No pathologically enlarged lymph nodes seen in the chest. Lungs/Pleura: Central airways are patent. No consolidation, pleural effusion or pneumothorax.  Upper Abdomen: No acute abnormality. Musculoskeletal: No chest wall mass or suspicious bone lesions identified. IMPRESSION: No acute extracardiac abnormality. Electronically Signed   By: Yetta Glassman M.D.   On: 07/10/2021 16:25   Result Date: 07/10/2021 CLINICAL DATA:  Chest pain EXAM: Cardiac/Coronary  CTA TECHNIQUE: The patient was scanned on a Siemens Somatom go.Top scanner. : A retrospective scan was triggered in the descending thoracic aorta. Axial non-contrast 3 mm slices were carried out through the heart. The data set was analyzed on a dedicated work station and scored using the Kreamer. Gantry rotation speed was 330 msecs and collimation was .6 mm. '20mg'$  of metoprolol iv, and 0.8 mg of sl NTG was given. The 3D data set was reconstructed in 5% intervals of the 60-95 % of the R-R cycle. Diastolic phases were analyzed on a dedicated work station using MPR, MIP and VRT modes. The patient received 100 cc of contrast. FINDINGS: Aorta: Normal size. Minimal descending aorta calcifications. No dissection. Aortic Valve:  Trileaflet.  No calcifications. Coronary Arteries:  Normal coronary origin.  Right dominance. RCA is a dominant artery that gives rise to PDA and PLA. There is calcified plaque in the proximal RCA causing mild stenosis (25-49%). Left main gives rise to LAD and LCX arteries. There is no LM disease. LAD has calcified plaque causing severe stenosis (>70%) in the mid segment. LCX is a non-dominant artery that gives rise to two obtuse marginal branches. There is calcified plaque in the proximal LCx causing minimal stenosis (<25%). Other findings: Normal pulmonary vein drainage into the left atrium. Normal left atrial appendage without a thrombus. Normal size of the pulmonary artery. IMPRESSION: 1. Coronary calcium score of 1149. This was 95th percentile for age and sex matched control. 2. Normal coronary origin with right dominance. 3. Calcified plaque causing severe stenosis (>70%) in the mid  LAD. 4. Calcified plaque causing mild proximal RCA stenosis (25-49%). 5. CAD-RADS 4 Severe stenosis. (70-99% or > 50% left main). Cardiac catheterization is recommended. Consider symptom-guided anti-ischemic pharmacotherapy as well as risk factor modification per guideline directed care. 6.  Image quality degraded by obesity related artifacts. Electronically Signed: By: Kate Sable M.D. On: 07/10/2021 15:43    EKG: Normal sinus rhythm otherwise normal EKG  Weights: Filed Weights   07/14/21 1523 07/15/21 0615  Weight: 118.4 kg 117.5 kg     Physical Exam: Blood pressure (!) 148/94, pulse 64, temperature 97.7 F (36.5 C), temperature source Oral, resp. rate 18, height 6' (1.829 m), weight 117.5 kg, SpO2 96 %. Body mass index is 35.13 kg/m. General: Well developed, well nourished, in no acute distress. Head eyes ears nose throat: Normocephalic, atraumatic, sclera non-icteric, no xanthomas,  nares are without discharge. No apparent thyromegaly and/or mass  Lungs: Normal respiratory effort.  no wheezes, no rales, no rhonchi.  Heart: RRR with normal S1 S2. no murmur gallop, no rub, PMI is normal size and placement, carotid upstroke normal without bruit, jugular venous pressure is normal Abdomen: Soft, non-tender, non-distended with normoactive bowel sounds. No hepatomegaly. No rebound/guarding. No obvious abdominal masses. Abdominal aorta is normal size without bruit Extremities: No edema. no cyanosis, no clubbing, no ulcers  Peripheral : 2+ bilateral upper extremity pulses, 2+ bilateral femoral pulses, 2+ bilateral dorsal pedal pulse Neuro: Alert and oriented. No facial asymmetry. No focal deficit. Moves all extremities spontaneously. Musculoskeletal: Normal muscle tone without kyphosis Psych:  Responds to questions appropriately with a normal affect.    Assessment: 66 year old male with hypertension hyperlipidemia and anginal symptoms now relieved without evidence of acute myocardial  infarction or EKG changes with an abnormal stress test needing cardiac catheterization.  We have discussed at length management his current anginal symptoms and the potential for discharge to home if managed well with cardiac catheterization on Monday versus holding until Monday in the hospital depending on how he feels with ambulation.  Plan: 1.  Continuation of antianginal medication management including metoprolol and isosorbide 2.  Continue antihypertensive medication management including Avapro 3.  High intensity cholesterol therapy 4.  Aspirin 5.  Begin ambulation and follow-up for improvements of symptoms and if no evidence of symptoms or other issues possible discharge home with cardiac catheterization on Monday  Signed, Corey Skains M.D. [facility] 07/15/2021, 12:40 PM

## 2021-07-15 NOTE — Assessment & Plan Note (Signed)
-   We will continue PPI therapy 

## 2021-07-15 NOTE — Progress Notes (Addendum)
PROGRESS NOTE    Ian Allen  PPJ:[id number] DOB: Jul 05, 1955 DOA: 07/14/2021 PCP: [facility]   Assessment & Plan:   Principal Problem:   Chest pain Active Problems:   Essential hypertension   Dyslipidemia   Gout   Dementia without behavioral disturbance (HCC)   Hypothyroidism   Depression   GERD without esophagitis  Assessment and Plan: Chest pain: w/ troponins neg x 2 but CT coronaries on 07/10/21 showed severe stenosis (70-99% of left main) and cardiac cath was recommended. Continue on aspirim, statin. Morphine, nitro prn. Cardio consulted    GERD: continue on PPI   Depression: severity unknown. Continue on home dose of sertraline   Hypothyroidism: continue on home dose of synthroid    Dementia: continue on home dose of donepezil    Gout: continue on home dose of allopurinol   HLD: continue on statin   HTN: continue on HCTZ, irbesartan, metoprolol   Obesity: BMI 35.1. Complicates overall care & prognosis    DVT prophylaxis: lovenox  Code Status: full  Family Communication: discussed pt's care w/ pt's family at bedside and answered his questions Disposition Plan: likely d/c back home   Level of care: Telemetry Cardiac  Status is: Observation The patient remains OBS appropriate and will d/c before 2 midnights.   Consultants:  Cardio   Procedures:   Antimicrobials:   Subjective: Pt c/o chest pain   Objective: Vitals:   07/15/21 0616 07/15/21 0652 07/15/21 0743 07/15/21 0812  BP: (!) 181/112 (!) 177/100 (!) 158/93 (!) 158/94  Pulse: 62 (!) 53 64 69  Resp: 18   18  Temp: 97.7 F (36.5 C)     TempSrc: Oral Oral    SpO2: 97% 98% 95% 96%  Weight:      Height:        Intake/Output Summary (Last 24 hours) at 07/15/2021 0824 Last data filed at 07/15/2021 0618 Gross per 24 hour  Intake 555.13 ml  Output --  Net 555.13 ml   Filed Weights   07/14/21 1523 07/15/21 0615  Weight: 118.4 kg 117.5 kg    Examination:  General exam: Appears  calm but uncomfortable  Respiratory system: Clear to auscultation. Respiratory effort normal. Cardiovascular system: S1 & S2 +. No rubs, gallops or clicks. No pedal edema. Gastrointestinal system: Abdomen is obese, soft and nontender. Normal bowel sounds heard. Central nervous system: Alert and oriented. Moves all extremities  Psychiatry: Judgement and insight appear normal. Mood & affect appropriate.     Data Reviewed: I have personally reviewed following labs and imaging studies  CBC: Recent Labs  Lab 07/14/21 1530 07/15/21 0654  WBC 8.4 7.8  HGB 17.7* 17.5*  HCT 49.7 49.2  MCV 84.5 84.1  PLT 238 580   Basic Metabolic Panel: Recent Labs  Lab 07/10/21 1216 07/14/21 1530 07/15/21 0654  NA  --  138 137  K  --  3.7 3.5  CL  --  109 109  CO2  --  23 23  GLUCOSE  --  121* 103*  BUN  --  18 17  CREATININE 1.10 1.10 0.99  CALCIUM  --  9.2 9.2   GFR: Estimated Creatinine Clearance: 99.8 mL/min (by C-G formula based on SCr of 0.99 mg/dL). Liver Function Tests: No results for input(s): "AST", "ALT", "ALKPHOS", "BILITOT", "PROT", "ALBUMIN" in the last 168 hours. No results for input(s): "LIPASE", "AMYLASE" in the last 168 hours. No results for input(s): "AMMONIA" in the last 168 hours. Coagulation Profile: No results for  input(s): "INR", "PROTIME" in the last 168 hours. Cardiac Enzymes: No results for input(s): "CKTOTAL", "CKMB", "CKMBINDEX", "TROPONINI" in the last 168 hours. BNP (last 3 results) No results for input(s): "PROBNP" in the last 8760 hours. HbA1C: No results for input(s): "HGBA1C" in the last 72 hours. CBG: No results for input(s): "GLUCAP" in the last 168 hours. Lipid Profile: No results for input(s): "CHOL", "HDL", "LDLCALC", "TRIG", "CHOLHDL", "LDLDIRECT" in the last 72 hours. Thyroid Function Tests: No results for input(s): "TSH", "T4TOTAL", "FREET4", "T3FREE", "THYROIDAB" in the last 72 hours. Anemia Panel: No results for input(s): "VITAMINB12",  "FOLATE", "FERRITIN", "TIBC", "IRON", "RETICCTPCT" in the last 72 hours. Sepsis Labs: No results for input(s): "PROCALCITON", "LATICACIDVEN" in the last 168 hours.  No results found for this or any previous visit (from the past 240 hour(s)).       Radiology Studies: DG Chest 2 View  Result Date: 07/14/2021 CLINICAL DATA:  Intermittent chest pain and shortness of breath EXAM: CHEST - 2 VIEW COMPARISON:  07/02/2020 FINDINGS: The heart size and mediastinal contours are within normal limits. Both lungs are clear. The visualized skeletal structures are unremarkable. IMPRESSION: No active cardiopulmonary disease. Electronically Signed   By: Randa Ngo M.D.   On: 07/14/2021 16:15        Scheduled Meds:  allopurinol  100 mg Oral Daily   aspirin EC  81 mg Oral Daily   atorvastatin  20 mg Oral QPM   donepezil  5 mg Oral Daily   enoxaparin (LOVENOX) injection  0.5 mg/kg Subcutaneous Q24H   hydrochlorothiazide  12.5 mg Oral Daily   irbesartan  37.5 mg Oral Daily   levothyroxine  75 mcg Oral Q0600   metoprolol succinate  50 mg Oral BID   pantoprazole  40 mg Oral Daily   sertraline  50 mg Oral QHS   Continuous Infusions:  sodium chloride 100 mL/hr at 07/15/21 0653     LOS: 0 days    Time spent: 36 mins     Wyvonnia Dusky, MD [facility] Pager [phone]  If 7PM-7AM, please contact night-coverage [email or web address] 07/15/2021, 8:24 AM

## 2021-07-15 NOTE — Assessment & Plan Note (Signed)
-   We will continue Zoloft 

## 2021-07-15 NOTE — Assessment & Plan Note (Signed)
-   We will continue Aricept. 

## 2021-07-15 NOTE — Assessment & Plan Note (Signed)
-   We will continue Synthroid. 

## 2021-07-16 DIAGNOSIS — E669 Obesity, unspecified: Secondary | ICD-10-CM | POA: Diagnosis not present

## 2021-07-16 DIAGNOSIS — R0789 Other chest pain: Secondary | ICD-10-CM | POA: Diagnosis not present

## 2021-07-16 DIAGNOSIS — I10 Essential (primary) hypertension: Secondary | ICD-10-CM | POA: Diagnosis not present

## 2021-07-16 LAB — CBC
HCT: 46.1 % (ref 39.0–52.0)
Hemoglobin: 16.1 g/dL (ref 13.0–17.0)
MCH: 30.1 pg (ref 26.0–34.0)
MCHC: 34.9 g/dL (ref 30.0–36.0)
MCV: 86.3 fL (ref 80.0–100.0)
Platelets: 210 10*3/uL (ref 150–400)
RBC: 5.34 MIL/uL (ref 4.22–5.81)
RDW: 13.3 % (ref 11.5–15.5)
WBC: 11.1 10*3/uL — ABNORMAL HIGH (ref 4.0–10.5)
nRBC: 0 % (ref 0.0–0.2)

## 2021-07-16 LAB — BASIC METABOLIC PANEL
Anion gap: 5 (ref 5–15)
BUN: 22 mg/dL (ref 8–23)
CO2: 25 mmol/L (ref 22–32)
Calcium: 9.1 mg/dL (ref 8.9–10.3)
Chloride: 109 mmol/L (ref 98–111)
Creatinine, Ser: 1.24 mg/dL (ref 0.61–1.24)
GFR, Estimated: >60 mL/min (ref >60)
Glucose, Bld: 109 mg/dL — ABNORMAL HIGH (ref 70–99)
Potassium: 3.6 mmol/L (ref 3.5–5.1)
Sodium: 139 mmol/L (ref 135–145)

## 2021-07-16 MED ORDER — SODIUM CHLORIDE 0.9 % WEIGHT BASED INFUSION
1.0000 mL/kg/h | INTRAVENOUS | Status: DC
  Administered 2021-07-17: 1 mL/kg/h via INTRAVENOUS

## 2021-07-16 MED ORDER — SODIUM CHLORIDE 0.9 % IV SOLN: 250.0000 mL | INTRAVENOUS | Status: DC | PRN

## 2021-07-16 MED ORDER — SODIUM CHLORIDE 0.9 % WEIGHT BASED INFUSION
3.0000 mL/kg/h | INTRAVENOUS | Status: AC
  Administered 2021-07-17: 3 mL/kg/h via INTRAVENOUS

## 2021-07-16 MED ORDER — ASPIRIN 81 MG PO CHEW
81.0000 mg | CHEWABLE_TABLET | ORAL | Status: AC
  Administered 2021-07-17: 81 mg via ORAL
  Filled 2021-07-16: qty 1

## 2021-07-16 MED ORDER — SODIUM CHLORIDE 0.9% FLUSH: 3.0000 mL | INTRAVENOUS | Status: DC | PRN

## 2021-07-16 MED ORDER — SODIUM CHLORIDE 0.9% FLUSH
3.0000 mL | Freq: Two times a day (BID) | INTRAVENOUS | Status: DC
  Administered 2021-07-17 – 2021-07-18 (×2): 3 mL via INTRAVENOUS

## 2021-07-16 NOTE — Progress Notes (Signed)
[facility] Encounter Note  Patient: Ian Allen / Admit Date: 07/14/2021 / Date of Encounter: 07/16/2021, 4:17 PM   Subjective: Patient overall feels well with no evidence of further chest discomfort at this time.  No true angina at rest.  No evidence of myocardial infarction or congestive heart failure.  Patient does have an abnormal stress test with a consistent anterior apical myocardial perfusion defect Consistent with ischemia needing further investigation including cardiac catheterization  Review of Systems: Positive for: None Negative for: Vision change, hearing change, syncope, dizziness, nausea, vomiting,diarrhea, bloody stool, stomach pain, cough, congestion, diaphoresis, urinary frequency, urinary pain,skin lesions, skin rashes Others previously listed  Objective: Telemetry: Normal sinus rhythm Physical Exam: Blood pressure 137/89, pulse 62, temperature 98 F (36.7 C), resp. rate 18, height 6' (1.829 m), weight 117.5 kg, SpO2 93 %. Body mass index is 35.13 kg/m. General: Well developed, well nourished, in no acute distress. Head: Normocephalic, atraumatic, sclera non-icteric, no xanthomas, nares are without discharge. Neck: No apparent masses Lungs: Normal respirations with no wheezes, no rhonchi, no rales , no crackles   Heart: Regular rate and rhythm, normal S1 S2, no murmur, no rub, no gallop, PMI is normal size and placement, carotid upstroke normal without bruit, jugular venous pressure normal Abdomen: Soft, non-tender, non-distended with normoactive bowel sounds. No hepatosplenomegaly. Abdominal aorta is normal size without bruit Extremities: No edema, no clubbing, no cyanosis, no ulcers,  Peripheral: 2+ radial, 2+ femoral, 2+ dorsal pedal pulses Neuro: Alert and oriented. Moves all extremities spontaneously. Psych:  Responds to questions appropriately with a normal affect.  No intake or output data in the 24 hours ending 07/16/21  1617  Inpatient Medications:   allopurinol  100 mg Oral Daily   amLODipine  5 mg Oral Daily   aspirin EC  81 mg Oral Daily   atorvastatin  20 mg Oral QPM   donepezil  5 mg Oral Daily   enoxaparin (LOVENOX) injection  0.5 mg/kg Subcutaneous Q24H   hydrochlorothiazide  12.5 mg Oral Daily   irbesartan  37.5 mg Oral Daily   isosorbide mononitrate  30 mg Oral Daily   levothyroxine  75 mcg Oral Q0600   metoprolol succinate  50 mg Oral BID   pantoprazole  40 mg Oral Daily   sertraline  50 mg Oral QHS   sodium chloride flush  3 mL Intravenous Q12H   Infusions:   Labs: Recent Labs    07/15/21 0654 07/16/21 0534  NA 137 139  K 3.5 3.6  CL 109 109  CO2 23 25  GLUCOSE 103* 109*  BUN 17 22  CREATININE 0.99 1.24  CALCIUM 9.2 9.1   No results for input(s): "AST", "ALT", "ALKPHOS", "BILITOT", "PROT", "ALBUMIN" in the last 72 hours. Recent Labs    07/15/21 0654 07/16/21 0534  WBC 7.8 11.1*  HGB 17.5* 16.1  HCT 49.2 46.1  MCV 84.1 86.3  PLT 228 210   No results for input(s): "CKTOTAL", "CKMB", "TROPONINI" in the last 72 hours. Invalid input(s): "POCBNP" No results for input(s): "HGBA1C" in the last 72 hours.   Weights: Filed Weights   07/14/21 1523 07/15/21 0615  Weight: 118.4 kg 117.5 kg     Radiology/Studies:  DG Chest 2 View  Result Date: 07/14/2021 CLINICAL DATA:  Intermittent chest pain and shortness of breath EXAM: CHEST - 2 VIEW COMPARISON:  07/02/2020 FINDINGS: The heart size and mediastinal contours are within normal limits. Both lungs are clear. The visualized skeletal structures are unremarkable. IMPRESSION:  No active cardiopulmonary disease. Electronically Signed   By: Randa Ngo M.D.   On: 07/14/2021 16:15   CT CORONARY MORPH W/CTA COR W/SCORE W/CA W/CM &/OR WO/CM  Addendum Date: 07/10/2021   ADDENDUM REPORT: 07/10/2021 16:25 EXAM: OVER-READ INTERPRETATION  CT CHEST The following report is an over-read performed by radiologist Dr. Maudry Diego  [facility] on 07/10/2021. This over-read does not include interpretation of cardiac or coronary anatomy or pathology. The cardiac/coronary CTA interpretation by the cardiologist is attached. COMPARISON:  Chest CT dated October 22, 2020 FINDINGS: Vascular: Normal heart size. Trace pericardial fluid. No suspicious filling defects of the central pulmonary arteries. Normal caliber thoracic aorta with mild atherosclerotic disease. Mediastinum/Nodes: Small hiatal hernia. No pathologically enlarged lymph nodes seen in the chest. Lungs/Pleura: Central airways are patent. No consolidation, pleural effusion or pneumothorax. Upper Abdomen: No acute abnormality. Musculoskeletal: No chest wall mass or suspicious bone lesions identified. IMPRESSION: No acute extracardiac abnormality. Electronically Signed   By: Yetta Glassman M.D.   On: 07/10/2021 16:25   Result Date: 07/10/2021 CLINICAL DATA:  Chest pain EXAM: Cardiac/Coronary  CTA TECHNIQUE: The patient was scanned on a Siemens Somatom go.Top scanner. : A retrospective scan was triggered in the descending thoracic aorta. Axial non-contrast 3 mm slices were carried out through the heart. The data set was analyzed on a dedicated work station and scored using the [place]. Gantry rotation speed was 330 msecs and collimation was .6 mm. '20mg'$  of metoprolol iv, and 0.8 mg of sl NTG was given. The 3D data set was reconstructed in 5% intervals of the 60-95 % of the R-R cycle. Diastolic phases were analyzed on a dedicated work station using MPR, MIP and VRT modes. The patient received 100 cc of contrast. FINDINGS: Aorta: Normal size. Minimal descending aorta calcifications. No dissection. Aortic Valve:  Trileaflet.  No calcifications. Coronary Arteries:  Normal coronary origin.  Right dominance. RCA is a dominant artery that gives rise to PDA and PLA. There is calcified plaque in the proximal RCA causing mild stenosis (25-49%). Left main gives rise to LAD and LCX  arteries. There is no LM disease. LAD has calcified plaque causing severe stenosis (>70%) in the mid segment. LCX is a non-dominant artery that gives rise to two obtuse marginal branches. There is calcified plaque in the proximal LCx causing minimal stenosis (<25%). Other findings: Normal pulmonary vein drainage into the left atrium. Normal left atrial appendage without a thrombus. Normal size of the pulmonary artery. IMPRESSION: 1. Coronary calcium score of 1149. This was 95th percentile for age and sex matched control. 2. Normal coronary origin with right dominance. 3. Calcified plaque causing severe stenosis (>70%) in the mid LAD. 4. Calcified plaque causing mild proximal RCA stenosis (25-49%). 5. CAD-RADS 4 Severe stenosis. (70-99% or > 50% left main). Cardiac catheterization is recommended. Consider symptom-guided anti-ischemic pharmacotherapy as well as risk factor modification per guideline directed care. 6.  Image quality degraded by obesity related artifacts. Electronically Signed: By: Kate Sable M.D. On: 07/10/2021 15:43     Assessment and Recommendation  66 y.o. male with hypertension hyperlipidemia and abnormal stress test with unstable angina without evidence of heart failure or congestive heart failure and or acute coronary syndrome 1.  Continue antianginal medication management 2.  High intensity cholesterol therapy 3.  Procedure cardiac catheterization to assess coronary anatomy and further treatment thereof as necessary.  Patient understands risk and benefits of cardiac catheterization.  This includes a possibility of death stroke heart attack  infection bleeding or blood clot.  He is at low risk for conscious sedation  Signed, Serafina Royals M.D. FACC

## 2021-07-16 NOTE — Progress Notes (Signed)
PROGRESS NOTE    Ian Allen  SHF:[id number] DOB: 19-May-1955 DOA: 07/14/2021 [facility]: [facility]   Assessment & Plan:   Principal Problem:   Chest pain Active Problems:   Essential hypertension   Dyslipidemia   Gout   Dementia without behavioral disturbance (HCC)   Hypothyroidism   Depression   GERD without esophagitis  Assessment and Plan: Chest pain: w/ troponins neg x 2 but CT coronaries on 07/10/21 showed severe stenosis (70-99% of left main) and cardiac cath was recommended. Shortness of breath w/ minimal exertion. Continue on aspirin, statin. Morphine, nitro prn. Cardiac cath tomorrow    GERD: continue on PPI   Depression: severity unknown. Continue on home dose of sertraline    Hypothyroidism: continue on home of levothyroxine    Dementia: continue on home dose of aricept    Gout: continue on home dose of allopurinol   HLD: continue on statin   HTN: continue on metoprolol, HCTZ, irbesartan    Obesity: BMI 35.1. Complicates overall care & prognosis     DVT prophylaxis: lovenox  Code Status: full  Family Communication: discussed pt's care w/ pt's family  and answered their questions Disposition Plan: likely d/c back home   Level of care: Telemetry Cardiac  Status is: Observation The patient remains OBS appropriate and will d/c before 2 midnights.   Consultants:  Cardio   Procedures:   Antimicrobials:   Subjective: Pt c/o shortness of breath w/ minimal exertion   Objective: Vitals:   07/15/21 1948 07/16/21 0001 07/16/21 0301 07/16/21 0747  BP: 124/82 132/75 126/71 137/89  Pulse: 69 (!) 57 65 62  Resp: '18 16 18 18  '$ Temp: 97.8 F (36.6 C) 97.7 F (36.5 C) 98.1 F (36.7 C)   TempSrc:      SpO2: 96% 96% 96% 93%  Weight:      Height:        Intake/Output Summary (Last 24 hours) at 07/16/2021 0748 Last data filed at 07/15/2021 1502 Gross per 24 hour  Intake 811.87 ml  Output --  Net 811.87 ml   Filed Weights   07/14/21 1523  07/15/21 0615  Weight: 118.4 kg 117.5 kg    Examination:  General exam: Appears comfortable  Respiratory system: clear breath sounds b/l  Cardiovascular system: S1/S2+. No rubs or gallops  Gastrointestinal system: Abd is soft, NT, ND, obese & hypoactive bowel sounds  Central nervous system: alert and oriented. Moves all extremities  Psychiatry: judgement and insight appears normal. Appropriate mood and affect      Data Reviewed: I have personally reviewed following labs and imaging studies  CBC: Recent Labs  Lab 07/14/21 1530 07/15/21 0654 07/16/21 0534  WBC 8.4 7.8 11.1*  HGB 17.7* 17.5* 16.1  HCT 49.7 49.2 46.1  MCV 84.5 84.1 86.3  PLT 238 228 502   Basic Metabolic Panel: Recent Labs  Lab 07/10/21 1216 07/14/21 1530 07/15/21 0654 07/16/21 0534  NA  --  138 137 139  K  --  3.7 3.5 3.6  CL  --  109 109 109  CO2  --  '23 23 25  '$ GLUCOSE  --  121* 103* 109*  BUN  --  '18 17 22  '$ CREATININE 1.10 1.10 0.99 1.24  CALCIUM  --  9.2 9.2 9.1   GFR: Estimated Creatinine Clearance: 79.7 mL/min (by C-G formula based on SCr of 1.24 mg/dL). Liver Function Tests: No results for input(s): "AST", "ALT", "ALKPHOS", "BILITOT", "PROT", "ALBUMIN" in the last 168 hours. No results  for input(s): "LIPASE", "AMYLASE" in the last 168 hours. No results for input(s): "AMMONIA" in the last 168 hours. Coagulation Profile: No results for input(s): "INR", "PROTIME" in the last 168 hours. Cardiac Enzymes: No results for input(s): "CKTOTAL", "CKMB", "CKMBINDEX", "TROPONINI" in the last 168 hours. BNP (last 3 results) No results for input(s): "PROBNP" in the last 8760 hours. HbA1C: No results for input(s): "HGBA1C" in the last 72 hours. CBG: No results for input(s): "GLUCAP" in the last 168 hours. Lipid Profile: No results for input(s): "CHOL", "HDL", "LDLCALC", "TRIG", "CHOLHDL", "LDLDIRECT" in the last 72 hours. Thyroid Function Tests: No results for input(s): "TSH", "T4TOTAL", "FREET4",  "T3FREE", "THYROIDAB" in the last 72 hours. Anemia Panel: No results for input(s): "VITAMINB12", "FOLATE", "FERRITIN", "TIBC", "IRON", "RETICCTPCT" in the last 72 hours. Sepsis Labs: No results for input(s): "PROCALCITON", "LATICACIDVEN" in the last 168 hours.  No results found for this or any previous visit (from the past 240 hour(s)).       Radiology Studies: DG Chest 2 View  Result Date: 07/14/2021 CLINICAL DATA:  Intermittent chest pain and shortness of breath EXAM: CHEST - 2 VIEW COMPARISON:  07/02/2020 FINDINGS: The heart size and mediastinal contours are within normal limits. Both lungs are clear. The visualized skeletal structures are unremarkable. IMPRESSION: No active cardiopulmonary disease. Electronically Signed   By: Randa Ngo M.D.   On: 07/14/2021 16:15        Scheduled Meds:  allopurinol  100 mg Oral Daily   amLODipine  5 mg Oral Daily   aspirin EC  81 mg Oral Daily   atorvastatin  20 mg Oral QPM   donepezil  5 mg Oral Daily   enoxaparin (LOVENOX) injection  0.5 mg/kg Subcutaneous Q24H   hydrochlorothiazide  12.5 mg Oral Daily   irbesartan  37.5 mg Oral Daily   isosorbide mononitrate  30 mg Oral Daily   levothyroxine  75 mcg Oral Q0600   metoprolol succinate  50 mg Oral BID   pantoprazole  40 mg Oral Daily   pneumococcal 20-valent conjugate vaccine  0.5 mL Intramuscular Tomorrow-1000   sertraline  50 mg Oral QHS   Continuous Infusions:     LOS: 0 days    Time spent: 30 mins     Wyvonnia Dusky, MD [facility] Pager [phone]  If 7PM-7AM, please contact night-coverage [email or web address] 07/16/2021, 7:48 AM

## 2021-07-17 ENCOUNTER — Other Ambulatory Visit (HOSPITAL_COMMUNITY): Payer: Self-pay

## 2021-07-17 ENCOUNTER — Other Ambulatory Visit: Payer: Self-pay

## 2021-07-17 ENCOUNTER — Telehealth (HOSPITAL_COMMUNITY): Payer: Self-pay | Admitting: Pharmacy Technician

## 2021-07-17 ENCOUNTER — Encounter
Admission: EM | Disposition: A | Discharge status: Home / Self Care | Payer: Self-pay | Source: Home / Self Care | Attending: Internal Medicine

## 2021-07-17 DIAGNOSIS — Z8673 Personal history of transient ischemic attack (TIA), and cerebral infarction without residual deficits: Secondary | ICD-10-CM | POA: Diagnosis not present

## 2021-07-17 DIAGNOSIS — E785 Hyperlipidemia, unspecified: Secondary | ICD-10-CM | POA: Diagnosis not present

## 2021-07-17 DIAGNOSIS — Z6835 Body mass index (BMI) 35.0-35.9, adult: Secondary | ICD-10-CM | POA: Diagnosis not present

## 2021-07-17 DIAGNOSIS — E669 Obesity, unspecified: Secondary | ICD-10-CM | POA: Diagnosis present

## 2021-07-17 DIAGNOSIS — E7800 Pure hypercholesterolemia, unspecified: Secondary | ICD-10-CM | POA: Diagnosis present

## 2021-07-17 DIAGNOSIS — I25110 Atherosclerotic heart disease of native coronary artery with unstable angina pectoris: Secondary | ICD-10-CM | POA: Diagnosis present

## 2021-07-17 DIAGNOSIS — Z79899 Other long term (current) drug therapy: Secondary | ICD-10-CM | POA: Diagnosis not present

## 2021-07-17 DIAGNOSIS — K219 Gastro-esophageal reflux disease without esophagitis: Secondary | ICD-10-CM | POA: Diagnosis present

## 2021-07-17 DIAGNOSIS — M109 Gout, unspecified: Secondary | ICD-10-CM | POA: Diagnosis present

## 2021-07-17 DIAGNOSIS — I200 Unstable angina: Secondary | ICD-10-CM | POA: Diagnosis not present

## 2021-07-17 DIAGNOSIS — F32A Depression, unspecified: Secondary | ICD-10-CM | POA: Diagnosis present

## 2021-07-17 DIAGNOSIS — R0789 Other chest pain: Secondary | ICD-10-CM | POA: Diagnosis not present

## 2021-07-17 DIAGNOSIS — E039 Hypothyroidism, unspecified: Secondary | ICD-10-CM | POA: Diagnosis present

## 2021-07-17 DIAGNOSIS — Z23 Encounter for immunization: Secondary | ICD-10-CM | POA: Diagnosis not present

## 2021-07-17 DIAGNOSIS — R079 Chest pain, unspecified: Secondary | ICD-10-CM | POA: Diagnosis present

## 2021-07-17 DIAGNOSIS — Z79890 Hormone replacement therapy (postmenopausal): Secondary | ICD-10-CM | POA: Diagnosis not present

## 2021-07-17 DIAGNOSIS — Z7982 Long term (current) use of aspirin: Secondary | ICD-10-CM | POA: Diagnosis not present

## 2021-07-17 DIAGNOSIS — I10 Essential (primary) hypertension: Secondary | ICD-10-CM | POA: Diagnosis present

## 2021-07-17 DIAGNOSIS — E876 Hypokalemia: Secondary | ICD-10-CM | POA: Diagnosis present

## 2021-07-17 DIAGNOSIS — F0283 Dementia in other diseases classified elsewhere, unspecified severity, with mood disturbance: Secondary | ICD-10-CM | POA: Diagnosis present

## 2021-07-17 HISTORY — PX: LEFT HEART CATH AND CORONARY ANGIOGRAPHY: CATH118249

## 2021-07-17 HISTORY — PX: CORONARY STENT INTERVENTION: CATH118234

## 2021-07-17 LAB — CBC
HCT: 46.2 % (ref 39.0–52.0)
Hemoglobin: 16.1 g/dL (ref 13.0–17.0)
MCH: 30.1 pg (ref 26.0–34.0)
MCHC: 34.8 g/dL (ref 30.0–36.0)
MCV: 86.4 fL (ref 80.0–100.0)
Platelets: 200 10*3/uL (ref 150–400)
RBC: 5.35 MIL/uL (ref 4.22–5.81)
RDW: 13.2 % (ref 11.5–15.5)
WBC: 8.7 10*3/uL (ref 4.0–10.5)
nRBC: 0 % (ref 0.0–0.2)

## 2021-07-17 LAB — BASIC METABOLIC PANEL
Anion gap: 9 (ref 5–15)
BUN: 19 mg/dL (ref 8–23)
CO2: 23 mmol/L (ref 22–32)
Calcium: 9.1 mg/dL (ref 8.9–10.3)
Chloride: 106 mmol/L (ref 98–111)
Creatinine, Ser: 1.13 mg/dL (ref 0.61–1.24)
GFR, Estimated: >60 mL/min (ref >60)
Glucose, Bld: 106 mg/dL — ABNORMAL HIGH (ref 70–99)
Potassium: 3.4 mmol/L — ABNORMAL LOW (ref 3.5–5.1)
Sodium: 138 mmol/L (ref 135–145)

## 2021-07-17 LAB — POCT ACTIVATED CLOTTING TIME: Activated Clotting Time: 419 seconds

## 2021-07-17 SURGERY — LEFT HEART CATH AND CORONARY ANGIOGRAPHY
Anesthesia: Moderate Sedation

## 2021-07-17 MED ORDER — ONDANSETRON HCL 4 MG/2ML IJ SOLN: 4.0000 mg | Freq: Four times a day (QID) | INTRAMUSCULAR | Status: DC | PRN

## 2021-07-17 MED ORDER — FENTANYL CITRATE (PF) 100 MCG/2ML IJ SOLN
INTRAMUSCULAR | Status: AC
  Filled 2021-07-17: qty 2

## 2021-07-17 MED ORDER — LIDOCAINE HCL 1 % IJ SOLN
INTRAMUSCULAR | Status: AC
  Filled 2021-07-17: qty 20

## 2021-07-17 MED ORDER — NITROGLYCERIN 1 MG/10 ML FOR IR/CATH LAB
INTRA_ARTERIAL | Status: AC
  Filled 2021-07-17: qty 10

## 2021-07-17 MED ORDER — TICAGRELOR 90 MG PO TABS
ORAL_TABLET | ORAL | Status: DC | PRN
  Administered 2021-07-17: 180 mg via ORAL

## 2021-07-17 MED ORDER — HEPARIN (PORCINE) IN NACL 1000-0.9 UT/500ML-% IV SOLN
INTRAVENOUS | Status: DC | PRN
  Administered 2021-07-17: 500 mL

## 2021-07-17 MED ORDER — TICAGRELOR 90 MG PO TABS
ORAL_TABLET | ORAL | Status: AC
  Filled 2021-07-17: qty 2

## 2021-07-17 MED ORDER — MORPHINE SULFATE (PF) 4 MG/ML IV SOLN
1.0000 mg | INTRAVENOUS | Status: DC | PRN
  Administered 2021-07-17 (×2): 1 mg via INTRAVENOUS

## 2021-07-17 MED ORDER — OXYCODONE-ACETAMINOPHEN 5-325 MG PO TABS: 1.0000 | ORAL_TABLET | Freq: Four times a day (QID) | ORAL | Status: DC | PRN

## 2021-07-17 MED ORDER — ACETAMINOPHEN 325 MG PO TABS
650.0000 mg | ORAL_TABLET | ORAL | Status: DC | PRN
  Filled 2021-07-17: qty 2

## 2021-07-17 MED ORDER — SODIUM CHLORIDE 0.9 % IV SOLN: 250.0000 mL | INTRAVENOUS | Status: DC | PRN

## 2021-07-17 MED ORDER — BIVALIRUDIN TRIFLUOROACETATE 250 MG IV SOLR
INTRAVENOUS | Status: AC
  Filled 2021-07-17: qty 250

## 2021-07-17 MED ORDER — IOHEXOL 300 MG/ML  SOLN
INTRAMUSCULAR | Status: DC | PRN
  Administered 2021-07-17: 300 mL

## 2021-07-17 MED ORDER — SODIUM CHLORIDE 0.9 % WEIGHT BASED INFUSION: 1.0000 mL/kg/h | INTRAVENOUS | Status: AC

## 2021-07-17 MED ORDER — MORPHINE SULFATE (PF) 2 MG/ML IV SOLN
INTRAVENOUS | Status: AC
  Filled 2021-07-17: qty 2

## 2021-07-17 MED ORDER — FENTANYL CITRATE (PF) 100 MCG/2ML IJ SOLN
INTRAMUSCULAR | Status: DC | PRN
Start: 2021-07-17 — End: 2021-07-17
  Administered 2021-07-17: 25 ug via INTRAVENOUS

## 2021-07-17 MED ORDER — ASPIRIN 81 MG PO CHEW
CHEWABLE_TABLET | ORAL | Status: DC | PRN
  Administered 2021-07-17: 324 mg via ORAL

## 2021-07-17 MED ORDER — HYDRALAZINE HCL 20 MG/ML IJ SOLN
10.0000 mg | INTRAMUSCULAR | Status: AC | PRN
Start: 2021-07-17 — End: 2021-07-17
  Administered 2021-07-17: 10 mg via INTRAVENOUS

## 2021-07-17 MED ORDER — MIDAZOLAM HCL 2 MG/2ML IJ SOLN
INTRAMUSCULAR | Status: DC | PRN
  Administered 2021-07-17: 1 mg via INTRAVENOUS

## 2021-07-17 MED ORDER — VERAPAMIL HCL 2.5 MG/ML IV SOLN
INTRAVENOUS | Status: DC | PRN
  Administered 2021-07-17: 2.5 mg via INTRA_ARTERIAL

## 2021-07-17 MED ORDER — HYDRALAZINE HCL 20 MG/ML IJ SOLN
INTRAMUSCULAR | Status: AC
  Filled 2021-07-17: qty 1

## 2021-07-17 MED ORDER — POTASSIUM CHLORIDE CRYS ER 20 MEQ PO TBCR: 20.0000 meq | EXTENDED_RELEASE_TABLET | Freq: Once | ORAL | Status: DC

## 2021-07-17 MED ORDER — ASPIRIN 81 MG PO CHEW: 81.0000 mg | CHEWABLE_TABLET | Freq: Every day | ORAL | Status: DC

## 2021-07-17 MED ORDER — SODIUM CHLORIDE 0.9 % IV SOLN
INTRAVENOUS | Status: DC | PRN
  Administered 2021-07-17 (×2): 1.75 mg/kg/h via INTRAVENOUS

## 2021-07-17 MED ORDER — SODIUM CHLORIDE 0.9% FLUSH
3.0000 mL | Freq: Two times a day (BID) | INTRAVENOUS | Status: DC
  Administered 2021-07-17 – 2021-07-18 (×2): 3 mL via INTRAVENOUS

## 2021-07-17 MED ORDER — HEPARIN SODIUM (PORCINE) 1000 UNIT/ML IJ SOLN
INTRAMUSCULAR | Status: AC
  Filled 2021-07-17: qty 10

## 2021-07-17 MED ORDER — VERAPAMIL HCL 2.5 MG/ML IV SOLN
INTRAVENOUS | Status: AC
  Filled 2021-07-17: qty 2

## 2021-07-17 MED ORDER — SODIUM CHLORIDE 0.9% FLUSH: 3.0000 mL | INTRAVENOUS | Status: DC | PRN

## 2021-07-17 MED ORDER — LORAZEPAM 1 MG PO TABS: 1.0000 mg | ORAL_TABLET | Freq: Four times a day (QID) | ORAL | Status: DC | PRN

## 2021-07-17 MED ORDER — MORPHINE SULFATE (PF) 2 MG/ML IV SOLN
INTRAVENOUS | Status: AC
  Filled 2021-07-17: qty 1

## 2021-07-17 MED ORDER — TICAGRELOR 90 MG PO TABS
90.0000 mg | ORAL_TABLET | Freq: Two times a day (BID) | ORAL | Status: DC
  Administered 2021-07-17 – 2021-07-18 (×2): 90 mg via ORAL
  Filled 2021-07-17 (×2): qty 1

## 2021-07-17 MED ORDER — HEPARIN (PORCINE) IN NACL 2000-0.9 UNIT/L-% IV SOLN
INTRAVENOUS | Status: DC | PRN
  Administered 2021-07-17: 1000 mL

## 2021-07-17 MED ORDER — HYDRALAZINE HCL 20 MG/ML IJ SOLN
INTRAMUSCULAR | Status: AC
  Administered 2021-07-17: 10 mg via INTRAVENOUS
  Filled 2021-07-17: qty 1

## 2021-07-17 MED ORDER — HEPARIN (PORCINE) IN NACL 1000-0.9 UT/500ML-% IV SOLN
INTRAVENOUS | Status: AC
  Filled 2021-07-17: qty 1000

## 2021-07-17 MED ORDER — ASPIRIN 81 MG PO CHEW
CHEWABLE_TABLET | ORAL | Status: AC
  Filled 2021-07-17: qty 4

## 2021-07-17 MED ORDER — MIDAZOLAM HCL 2 MG/2ML IJ SOLN
INTRAMUSCULAR | Status: AC
  Filled 2021-07-17: qty 2

## 2021-07-17 MED ORDER — NITROGLYCERIN 1 MG/10 ML FOR IR/CATH LAB
INTRA_ARTERIAL | Status: DC | PRN
  Administered 2021-07-17 (×2): 200 ug via INTRACORONARY

## 2021-07-17 MED ORDER — LABETALOL HCL 5 MG/ML IV SOLN: 10.0000 mg | INTRAVENOUS | Status: AC | PRN

## 2021-07-17 MED ORDER — BIVALIRUDIN BOLUS VIA INFUSION - CUPID
INTRAVENOUS | Status: DC | PRN
  Administered 2021-07-17: 88.125 mg via INTRAVENOUS

## 2021-07-17 MED ORDER — LIDOCAINE HCL (PF) 1 % IJ SOLN
INTRAMUSCULAR | Status: DC | PRN
  Administered 2021-07-17: 15 mL
  Administered 2021-07-17: 2 mL

## 2021-07-17 MED ORDER — FAMOTIDINE 20 MG PO TABS
40.0000 mg | ORAL_TABLET | Freq: Every day | ORAL | Status: DC
  Administered 2021-07-17 – 2021-07-18 (×2): 40 mg via ORAL
  Filled 2021-07-17 (×2): qty 2

## 2021-07-17 SURGICAL SUPPLY — 35 items
BALLN TREK RX 2.25X15 (BALLOONS) ×2
BALLN TREK RX 2.5X15 (BALLOONS) ×2
BALLN ~~LOC~~ EUPHORA RX 2.5X20 (BALLOONS) ×2
BALLN ~~LOC~~ EUPHORA RX 3.5X15 (BALLOONS) ×2
BALLOON TREK RX 2.25X15 (BALLOONS) IMPLANT
BALLOON TREK RX 2.5X15 (BALLOONS) IMPLANT
BALLOON ~~LOC~~ EUPHORA RX 2.5X20 (BALLOONS) IMPLANT
BALLOON ~~LOC~~ EUPHORA RX 3.5X15 (BALLOONS) IMPLANT
BAND ZEPHYR COMPRESS 30 LONG (HEMOSTASIS) ×1 IMPLANT
CATH 5FR JL3.5 JR4 ANG PIG MP (CATHETERS) ×1 IMPLANT
CATH INFINITI 5FR JL4 (CATHETERS) ×1 IMPLANT
CATH VISTA GUIDE 6FR JR4 (CATHETERS) ×1 IMPLANT
CATH VISTA GUIDE 6FR XB3.5 (CATHETERS) ×1 IMPLANT
DEVICE CLOSURE MYNXGRIP 6/7F (Vascular Products) ×1 IMPLANT
DRAPE BRACHIAL (DRAPES) ×1 IMPLANT
GLIDESHEATH SLEND SS 6F .021 (SHEATH) ×1 IMPLANT
GUIDEWIRE INQWIRE 1.5J.035X260 (WIRE) IMPLANT
INQWIRE 1.5J .035X260CM (WIRE) ×2
KIT ENCORE 26 ADVANTAGE (KITS) ×1 IMPLANT
KIT SYRINGE INJ CVI SPIKEX1 (MISCELLANEOUS) ×1 IMPLANT
NDL PERC 18GX7CM (NEEDLE) IMPLANT
NEEDLE PERC 18GX7CM (NEEDLE) ×2 IMPLANT
PACK CARDIAC CATH (CUSTOM PROCEDURE TRAY) ×2 IMPLANT
PROTECTION STATION PRESSURIZED (MISCELLANEOUS) ×2
SET ATX SIMPLICITY (MISCELLANEOUS) ×1 IMPLANT
SHEATH AVANTI 5FR X 11CM (SHEATH) ×1 IMPLANT
SHEATH AVANTI 6FR X 11CM (SHEATH) ×1 IMPLANT
STATION PROTECTION PRESSURIZED (MISCELLANEOUS) IMPLANT
STENT ONYX FRONTIER 2.25X22 (Permanent Stent) ×1 IMPLANT
STENT ONYX FRONTIER 3.0X18 (Permanent Stent) ×1 IMPLANT
TUBING CIL FLEX 10 FLL-RA (TUBING) ×1 IMPLANT
WIRE ASAHI PROWATER 180CM (WIRE) ×1 IMPLANT
WIRE G HI TQ BMW 190 (WIRE) ×1 IMPLANT
WIRE GUIDERIGHT .035X150 (WIRE) ×1 IMPLANT
WIRE HITORQ VERSACORE ST 145CM (WIRE) ×1 IMPLANT

## 2021-07-17 NOTE — TOC Benefit Eligibility Note (Signed)
Patient Teacher, English as a foreign language completed.    The patient is currently admitted and upon discharge could be taking Brilinta 90 mg.  The current 30 day co-pay is, $60.00.   The patient is insured through [place], [place] Patient [place] Patient Advocate Team Direct Number: [phone]  Fax: [phone]

## 2021-07-17 NOTE — Telephone Encounter (Signed)
Pharmacy Patient Advocate Encounter  Insurance verification completed.    The patient is insured through [employer]   The patient is currently admitted and ran test claims for the following: Brilinta.  Copays and coinsurance results were relayed to Inpatient clinical team.  

## 2021-07-17 NOTE — Progress Notes (Addendum)
PROGRESS NOTE    DOCTOR SHEAHAN  MVH:[id number] DOB: 11-26-1955 DOA: 07/14/2021 PCP: [facility]   Assessment & Plan:   Principal Problem:   Chest pain Active Problems:   Essential hypertension   Dyslipidemia   Gout   Dementia without behavioral disturbance (HCC)   Hypothyroidism   Depression   GERD without esophagitis  Assessment and Plan: Unstable angina: w/ troponins neg x 2 but CT coronaries on 07/10/21 showed severe stenosis (70-99% of left main) and cardiac cath was recommended. S/p cardiac cath in which 2 stents were placed. Will continue on aspirin, brilinta x 1 year at least as cardio. Morphine, nitro prn for pain. Cardio recs apprec   Hypokalemia: potassium ordered   GERD: continue on PPI   Depression: severity unknown. Continue on sertraline     Hypothyroidism: continue on home of levothyroxine    Dementia: continue on home dose of aricept    Gout: continue on home dose of allopurinol   HLD: continue on statin   HTN: continue on metoprolol, imdur, HCTZ, irbesartan     Obesity: BMI 35.1. Complicates overall care & prognosis      DVT prophylaxis: lovenox  Code Status: full  Family Communication: discussed pt's care w/ pt's family and answered their questions Disposition Plan: likely d/c back home   Level of care: Telemetry Cardiac  Status is: Observation The patient remains OBS appropriate and will d/c before 2 midnights.   Consultants:  Cardio   Procedures:   Antimicrobials:   Subjective: Pt c/o back pain   Objective: Vitals:   07/16/21 1628 07/16/21 2023 07/16/21 2344 07/17/21 0458  BP: 129/70 128/90 133/85 (!) 158/98  Pulse: 72 (!) 59 (!) 58 69  Resp: '17 17 17 17  '$ Temp: 98 F (36.7 C) 98.2 F (36.8 C) (!) 97.4 F (36.3 C) 97.8 F (36.6 C)  TempSrc:  Oral Oral   SpO2: 96% 98% 96% 96%  Weight:      Height:       No intake or output data in the 24 hours ending 07/17/21 0756  Filed Weights   07/14/21 1523 07/15/21 0615   Weight: 118.4 kg 117.5 kg    Examination:  General exam: Appears calm & comfortable  Respiratory system: clear breath sounds b/l  Cardiovascular system: S1 & S2+. No rubs or gallops  Gastrointestinal system: Abd is soft, NT, obese & normal bowel sounds Central nervous system: Alert and oriented. Moves all extremities  Psychiatry: judgement and insight appears normal. Appropriate mood and affect      Data Reviewed: I have personally reviewed following labs and imaging studies  CBC: Recent Labs  Lab 07/14/21 1530 07/15/21 0654 07/16/21 0534 07/17/21 0506  WBC 8.4 7.8 11.1* 8.7  HGB 17.7* 17.5* 16.1 16.1  HCT 49.7 49.2 46.1 46.2  MCV 84.5 84.1 86.3 86.4  PLT 238 228 210 841   Basic Metabolic Panel: Recent Labs  Lab 07/10/21 1216 07/14/21 1530 07/15/21 0654 07/16/21 0534 07/17/21 0506  NA  --  138 137 139 138  K  --  3.7 3.5 3.6 3.4*  CL  --  109 109 109 106  CO2  --  '23 23 25 23  '$ GLUCOSE  --  121* 103* 109* 106*  BUN  --  '18 17 22 19  '$ CREATININE 1.10 1.10 0.99 1.24 1.13  CALCIUM  --  9.2 9.2 9.1 9.1   GFR: Estimated Creatinine Clearance: 87.4 mL/min (by C-G formula based on SCr of 1.13 mg/dL). Liver  Function Tests: No results for input(s): "AST", "ALT", "ALKPHOS", "BILITOT", "PROT", "ALBUMIN" in the last 168 hours. No results for input(s): "LIPASE", "AMYLASE" in the last 168 hours. No results for input(s): "AMMONIA" in the last 168 hours. Coagulation Profile: No results for input(s): "INR", "PROTIME" in the last 168 hours. Cardiac Enzymes: No results for input(s): "CKTOTAL", "CKMB", "CKMBINDEX", "TROPONINI" in the last 168 hours. BNP (last 3 results) No results for input(s): "PROBNP" in the last 8760 hours. HbA1C: No results for input(s): "HGBA1C" in the last 72 hours. CBG: No results for input(s): "GLUCAP" in the last 168 hours. Lipid Profile: No results for input(s): "CHOL", "HDL", "LDLCALC", "TRIG", "CHOLHDL", "LDLDIRECT" in the last 72  hours. Thyroid Function Tests: No results for input(s): "TSH", "T4TOTAL", "FREET4", "T3FREE", "THYROIDAB" in the last 72 hours. Anemia Panel: No results for input(s): "VITAMINB12", "FOLATE", "FERRITIN", "TIBC", "IRON", "RETICCTPCT" in the last 72 hours. Sepsis Labs: No results for input(s): "PROCALCITON", "LATICACIDVEN" in the last 168 hours.  No results found for this or any previous visit (from the past 240 hour(s)).       Radiology Studies: No results found.      Scheduled Meds:  allopurinol  100 mg Oral Daily   amLODipine  5 mg Oral Daily   aspirin EC  81 mg Oral Daily   atorvastatin  20 mg Oral QPM   donepezil  5 mg Oral Daily   enoxaparin (LOVENOX) injection  0.5 mg/kg Subcutaneous Q24H   hydrochlorothiazide  12.5 mg Oral Daily   irbesartan  37.5 mg Oral Daily   isosorbide mononitrate  30 mg Oral Daily   levothyroxine  75 mcg Oral Q0600   metoprolol succinate  50 mg Oral BID   pantoprazole  40 mg Oral Daily   sertraline  50 mg Oral QHS   sodium chloride flush  3 mL Intravenous Q12H   Continuous Infusions:  sodium chloride     sodium chloride 1 mL/kg/hr (07/17/21 0646)      LOS: 0 days    Time spent: 30 mins     Wyvonnia Dusky, MD [facility] Pager [phone]  If 7PM-7AM, please contact night-coverage [email or web address] 07/17/2021, 7:56 AM

## 2021-07-17 NOTE — Progress Notes (Signed)
At around 1550, pt disclosed to noel RN that he had been having chest pain for the duration of his recovery. While pt had been having chest pain prior to procedure, pain went from pressure to sharpness post procedure. Ena Dawley RN made Dr. Saralyn Pilar aware and got new ekg and exported into epic. Treated with PRN medication order. PT also disclosed to [facility] that he was feeling anxiety, noel passed this information onto Dr. Saralyn Pilar. See orders.  Dropping pt off in 254 he endorsed CP being worse with deep breath. MD made aware.

## 2021-07-18 ENCOUNTER — Encounter: Payer: Self-pay | Admitting: Cardiology

## 2021-07-18 DIAGNOSIS — E669 Obesity, unspecified: Secondary | ICD-10-CM | POA: Diagnosis not present

## 2021-07-18 DIAGNOSIS — E785 Hyperlipidemia, unspecified: Secondary | ICD-10-CM

## 2021-07-18 DIAGNOSIS — I200 Unstable angina: Secondary | ICD-10-CM | POA: Diagnosis not present

## 2021-07-18 LAB — BASIC METABOLIC PANEL
Anion gap: 6 (ref 5–15)
BUN: 15 mg/dL (ref 8–23)
CO2: 23 mmol/L (ref 22–32)
Calcium: 8.9 mg/dL (ref 8.9–10.3)
Chloride: 108 mmol/L (ref 98–111)
Creatinine, Ser: 0.97 mg/dL (ref 0.61–1.24)
GFR, Estimated: >60 mL/min (ref >60)
Glucose, Bld: 109 mg/dL — ABNORMAL HIGH (ref 70–99)
Potassium: 3.4 mmol/L — ABNORMAL LOW (ref 3.5–5.1)
Sodium: 137 mmol/L (ref 135–145)

## 2021-07-18 LAB — CBC
HCT: 46.9 % (ref 39.0–52.0)
Hemoglobin: 16.4 g/dL (ref 13.0–17.0)
MCH: 30.3 pg (ref 26.0–34.0)
MCHC: 35 g/dL (ref 30.0–36.0)
MCV: 86.5 fL (ref 80.0–100.0)
Platelets: 202 10*3/uL (ref 150–400)
RBC: 5.42 MIL/uL (ref 4.22–5.81)
RDW: 13.2 % (ref 11.5–15.5)
WBC: 9.1 10*3/uL (ref 4.0–10.5)
nRBC: 0 % (ref 0.0–0.2)

## 2021-07-18 MED ORDER — ATORVASTATIN CALCIUM 80 MG PO TABS: 80.0000 mg | ORAL_TABLET | Freq: Every evening | ORAL | 0 refills | Status: AC

## 2021-07-18 MED ORDER — AMLODIPINE BESYLATE 5 MG PO TABS: 5.0000 mg | ORAL_TABLET | Freq: Every day | ORAL | 0 refills | Status: AC

## 2021-07-18 MED ORDER — ISOSORBIDE MONONITRATE ER 30 MG PO TB24: 30.0000 mg | ORAL_TABLET | Freq: Every day | ORAL | 0 refills | Status: AC

## 2021-07-18 MED ORDER — TICAGRELOR 90 MG PO TABS: 90.0000 mg | ORAL_TABLET | Freq: Two times a day (BID) | ORAL | 0 refills | Status: AC

## 2021-07-18 MED ORDER — POTASSIUM CHLORIDE CRYS ER 20 MEQ PO TBCR
20.0000 meq | EXTENDED_RELEASE_TABLET | Freq: Once | ORAL | Status: AC
Start: 2021-07-18 — End: 2021-07-18
  Administered 2021-07-18: 20 meq via ORAL
  Filled 2021-07-18: qty 1

## 2021-07-18 MED ORDER — ATORVASTATIN CALCIUM 80 MG PO TABS: 80.0000 mg | ORAL_TABLET | Freq: Every evening | ORAL | Status: DC

## 2021-07-18 NOTE — TOC CM/SW Note (Signed)
Patient has orders to discharge home today. Chart reviewed. PCP is [facility]. On room air. No wounds. Pharmacy said he has his Brilinta coupon. No TOC needs identified. CSW signing off.  Dayton Scrape, [place]

## 2021-07-18 NOTE — Progress Notes (Signed)
[facility] Cardiology    SUBJECTIVE: Ian Allen is a 60yoM with a PMH of CAD, hypertension, hypercholesterolemia, anxiety, frontotemporal dementia who presented to [facility] ED 07/14/2021 with midsternal chest pressure.  He has had a recent work-up on an outpatient basis after noting a 26-monthhistory of progressively worsening exertional chest pain, decreased exercise tolerance with cardiac CTA showing elevated coronary calcium score of 1149 and severe stenosis of the mid LAD.  He was scheduled to have LHC with Dr. PSaralyn Pilaron 7/20 but presented to the ED due to worsening chest discomfort.  He underwent LHC on 7/17 with Dr. PSaralyn Pilarand received PCI with DES x2 to the mid LAD and proximal/mid RCA.  Interval history: -Seen and examined late morning after some complaints of inability to take a deep breath -His chest discomfort resolved after stent placement and has not reoccurred.   Vitals:   07/17/21 2324 07/18/21 0606 07/18/21 0751 07/18/21 1132  BP:  (!) 158/98 (!) 162/96 (!) 148/89  Pulse:  64 61 91  Resp:  '17 18 18  '$ Temp:  98.2 F (36.8 C) 97.6 F (36.4 C) 97.7 F (36.5 C)  TempSrc:  Oral    SpO2: 95% 96% 96% 97%  Weight:      Height:         Intake/Output Summary (Last 24 hours) at 07/18/2021 1346 Last data filed at 07/18/2021 0858 Gross per 24 hour  Intake --  Output 0 ml  Net 0 ml     PHYSICAL EXAM  General: Pleasant Caucasian male, well nourished, in no acute distress.  Sitting upright in chair with his wife at bedside. HEENT:  Normocephalic and atraumatic. Neck:  No JVD.  Lungs: Normal respiratory effort on room air. Clear bilaterally to auscultation. No wheezes, crackles, rhonchi.  Heart: HRRR . Normal S1 and S2 without gallops or murmurs. Radial & DP pulses 2+ bilaterally. Abdomen: Obese appearing.  Msk: Normal strength and tone for age. Extremities: No clubbing, cyanosis or edema.   Neuro: Alert and oriented X 3. Psych: Somewhat anxious mood, answers questions  appropriately   LABS: Basic Metabolic Panel: Recent Labs    07/17/21 0506 07/18/21 0612  NA 138 137  K 3.4* 3.4*  CL 106 108  CO2 23 23  GLUCOSE 106* 109*  BUN 19 15  CREATININE 1.13 0.97  CALCIUM 9.1 8.9   Liver Function Tests: No results for input(s): "AST", "ALT", "ALKPHOS", "BILITOT", "PROT", "ALBUMIN" in the last 72 hours. No results for input(s): "LIPASE", "AMYLASE" in the last 72 hours. CBC: Recent Labs    07/17/21 0506 07/18/21 0612  WBC 8.7 9.1  HGB 16.1 16.4  HCT 46.2 46.9  MCV 86.4 86.5  PLT 200 202   Cardiac Enzymes: No results for input(s): "CKTOTAL", "CKMB", "CKMBINDEX", "TROPONINI" in the last 72 hours. BNP: Invalid input(s): "POCBNP" D-Dimer: No results for input(s): "DDIMER" in the last 72 hours. Hemoglobin A1C: No results for input(s): "HGBA1C" in the last 72 hours. Fasting Lipid Panel: No results for input(s): "CHOL", "HDL", "LDLCALC", "TRIG", "CHOLHDL", "LDLDIRECT" in the last 72 hours. Thyroid Function Tests: No results for input(s): "TSH", "T4TOTAL", "T3FREE", "THYROIDAB" in the last 72 hours.  Invalid input(s): "FREET3" Anemia Panel: No results for input(s): "VITAMINB12", "FOLATE", "FERRITIN", "TIBC", "IRON", "RETICCTPCT" in the last 72 hours.  CARDIAC CATHETERIZATION  Result Date: 07/17/2021   Prox Cx to Mid Cx lesion is 30% stenosed.   Prox Cx lesion is 40% stenosed.   Mid LAD-1 lesion is 90% stenosed.   Mid LAD-2  lesion is 30% stenosed.   Prox RCA lesion is 90% stenosed.   A drug-eluting stent was successfully placed using a STENT ONYX FRONTIER 2.25X22.   A drug-eluting stent was successfully placed using a STENT ONYX FRONTIER 3.0X18.   Post intervention, there is a 0% residual stenosis.   Post intervention, there is a 0% residual stenosis.   The left ventricular systolic function is normal.   LV end diastolic pressure is normal.   The left ventricular ejection fraction is 55-65% by visual estimate. 1.  Two-vessel coronary artery disease  with 90% stenosis mid LAD, 90% stenosis proximal/mid RCA 2.  Normal left ventricular function 3.  Successful two-vessel PCI with DES mid LAD and proximal/mid RCA Recommendations 1.  Dual antiplatelet therapy uninterrupted x1 year 2.  Aggressive risk factor modification     TELEMETRY reviewed by me: Sinus rhythm rate 70s  ASSESSMENT AND PLAN:  Principal Problem:   Chest pain Active Problems:   Essential hypertension   Dyslipidemia   Gout   Dementia without behavioral disturbance (HCC)   Hypothyroidism   Depression   GERD without esophagitis   Unstable angina (HCC)    #unstable angina #CAD s/p PCI with DES x2 to the mid LAD and proximal/mid RCA #shortness of breath  The patient overall feels better without reoccurring chest discomfort since his heart cath on 7/17.  He noted some shortness of breath post procedurally as he was lying flat for an extended period of time.  He noted some sensation of inability to get a deep breath but is better when he sits upright, he is not hypoxic and has good breath sounds bilaterally without wheezes or crackles.  Discussed at length with the patient and his wife the possible side effect of Brilinta and that this sensation may get better with time or he has the option to switch to Plavix for DAPT, although Brilinta is favored.  He is ultimately agreeable to continue Brilinta and knows to call Dr. Saralyn Pilar office with worsening shortness of breath and present to the ER should he develop recurrent chest pain or other warning symptoms.  After discussion he is very eager to go home and is overall feeling much better than when he initially presented. -Continue DAPT with aspirin 81 mg and Brilinta 90 mg twice daily uninterrupted for a year -Increase atorvastatin to 80 mg once daily -Continue metoprolol succinate 50 mg twice daily, irbesartan 37 mg once daily, isosorbide 30 mg once daily -Okay for discharge today from a cardiac standpoint with close follow-up in 1  to 2 weeks with Dr. Saralyn Pilar  This patient's case was discussed and created with Dr. Saralyn Pilar and he is in agreement.  Tristan Schroeder, PA-C 07/18/2021 1:46 PM

## 2021-07-18 NOTE — Progress Notes (Signed)
Mobility Specialist - Progress Note   07/18/21 1400  Mobility  Activity Ambulated independently in hallway  Level of Assistance Independent  Assistive Device None  Distance Ambulated (ft) 300 ft  Activity Response Tolerated well  $Mobility charge 1 Mobility     Pt ambulates 355f in hallway with one seated rest break. Denies any dizziness or chest pain and returns to room with needs in reach.  MMerrily BrittleMobility Specialist 07/18/21, 2:40 PM

## 2021-07-18 NOTE — Discharge Summary (Signed)
Physician Discharge Summary  Ian Allen CXK:[id number] DOB: 20-Feb-1955 DOA: 07/14/2021  PCP: [facility]  Admit date: 07/14/2021 Discharge date: 07/18/2021  Admitted From: home  Disposition:  home   Recommendations for Outpatient Follow-up:  Follow up with PCP in 1-2 weeks F/u w/ cardio, Dr. Saralyn Pilar, in 1-2 weeks  Home Health: no  Equipment/Devices:  Discharge Condition: stable  CODE STATUS: full  Diet recommendation: Heart Healthy   Brief/Interim Summary: HPI was taken from Dr. Sidney Ace: Ian Allen is a 66 y.o. Caucasian male with medical history significant for dementia, GERD, hypertension and dyslipidemia, who presented to the emergency room with acute onset of midsternal chest pain felt as pressure and graded 3/10 in severity with associated dyspnea and headache without nausea or vomiting or diaphoresis.  He denied any cough or wheezing or hemoptysis.  No bleeding diathesis.  No leg pain or edema or recent travels or surgeries.  The patient had a recent coronary CT revealing significant coronary artery disease for which she is scheduled to have a cardiac catheterization on 7/20 with Dr. Kerman Passey.  No dysuria, oliguria or hematuria or flank pain.  ED Course: Upon presentation to the emergency room, BP was 135/112 with otherwise normal vital signs.  Later BP was 182/98.  BMP was within normal.  CBC showed hemoconcentration.  High-sensitivity troponin I was 6 and later 7. EKG as reviewed by me : EKG showed normal sinus rhythm with a rate of 75 Imaging: Two-view chest x-ray showed no acute cardiopulmonary disease.  Recent coronary CTA on 7/10 revealed: 1. Coronary calcium score of 1149. This was 95th percentile for age and sex matched control.   2. Normal coronary origin with right dominance.   3. Calcified plaque causing severe stenosis (>70%) in the mid LAD.   4. Calcified plaque causing mild proximal RCA stenosis (25-49%).   5. CAD-RADS 4 Severe stenosis. (70-99%  or > 50% left main). Cardiac catheterization is recommended. Consider symptom-guided anti-ischemic pharmacotherapy as well as risk factor modification per guideline directed care.   6.  Image quality degraded by obesity related artifacts.    The patient was given 4 baby aspirin, 50 mg of p.o. Toprol-XL and 0.4 mg of sublingual nitroglycerin.  He will be admitted to an observation cardiac telemetry bed for further evaluation and management.        As per Dr. Jimmye Norman 7/15-7/18/23: Pt presented w/ unstable angina and had cardiac cath in which 2 stents were placed. Pt was started on brilinta and continued on aspirin as per cardio. Pt will continue on amlodipine, metoprolol, imdur, HCTZ, olmesartan. Also, pt's home dose of atorvastatin was increased to '80mg'$  daily as per cardio. Furthermore, pt was c/o shortness of breath at d/c but vital signs including SaO2 level were WNL, CXR showed no acute cardiopulmonary disease. Brilinta has ADR of shortness of breath. Pt agreed to continue on brilinta to see if the shortness of breath resolved and if not the brilinta would be changed to plavix as per cardio.   Discharge Diagnoses:  Principal Problem:   Chest pain Active Problems:   Essential hypertension   Dyslipidemia   Gout   Dementia without behavioral disturbance (HCC)   Hypothyroidism   Depression   GERD without esophagitis   Unstable angina (HCC)  Unstable angina: w/ troponins neg x 2 but CT coronaries on 07/10/21 showed severe stenosis (70-99% of left main) and cardiac cath was recommended. S/p cardiac cath in which 2 stents were placed. Will continue on aspirin, brilinta  x 1 year at least as cardio. Atorvastatin increased to '80mg'$  daily. Morphine, nitro prn for pain. Cardio recs apprec   Hypokalemia: potassium ordered   GERD: continue on PPI   Depression: severity unknown. Continue on sertraline     Hypothyroidism: continue on home of levothyroxine    Dementia: continue on home dose of  aricept    Gout: continue on home dose of allopurinol   HLD: continue on statin   HTN: continue on metoprolol, imdur, HCTZ, ARB   Obesity: BMI 35.1. Complicates overall care & prognosis   Discharge Instructions  Discharge Instructions     AMB Referral to Cardiac Rehabilitation - Phase II   Complete by: As directed    Diagnosis: Coronary Stents   After initial evaluation and assessments completed: Virtual Based Care may be provided alone or in conjunction with Phase 2 Cardiac Rehab based on patient barriers.: Yes   Diet - low sodium heart healthy   Complete by: As directed    Discharge instructions   Complete by: As directed    F/u w/ PCP in 1-2 weeks. F/u w/ cardio, Dr. Saralyn Pilar, in 1-2 weeks. No lifting >10 pounds for 10 days or until follow up with Dr. Saralyn Pilar   Increase activity slowly   Complete by: As directed       Allergies as of 07/18/2021   No Known Allergies      Medication List     STOP taking these medications    cloNIDine 0.1 MG tablet Commonly known as: CATAPRES   colchicine 0.6 MG tablet       TAKE these medications    allopurinol 100 MG tablet Commonly known as: ZYLOPRIM Take 100 mg by mouth daily.   amLODipine 5 MG tablet Commonly known as: NORVASC Take 1 tablet (5 mg total) by mouth daily. Start taking on: July 19, 2021   aspirin EC 81 MG tablet Take 1 tablet by mouth daily.   atorvastatin 80 MG tablet Commonly known as: LIPITOR Take 1 tablet (80 mg total) by mouth every evening. What changed:  medication strength how much to take   donepezil 5 MG tablet Commonly known as: ARICEPT Take 5 mg by mouth daily.   hydrochlorothiazide 12.5 MG tablet Commonly known as: HYDRODIURIL Take 12.5 mg by mouth daily.   isosorbide mononitrate 30 MG 24 hr tablet Commonly known as: IMDUR Take 1 tablet (30 mg total) by mouth daily. Start taking on: July 19, 2021   levothyroxine 75 MCG tablet Commonly known as: SYNTHROID Take 75 mcg by  mouth daily.   metoprolol succinate 50 MG 24 hr tablet Commonly known as: TOPROL-XL Take 50 mg by mouth 2 (two) times daily.   nitroGLYCERIN 0.4 MG SL tablet Commonly known as: NITROSTAT Place under the tongue.   olmesartan 20 MG tablet Commonly known as: BENICAR Take 20 mg by mouth 2 (two) times daily.   omeprazole 40 MG capsule Commonly known as: PRILOSEC Take 40 mg by mouth daily.   sertraline 50 MG tablet Commonly known as: ZOLOFT Take 50 mg by mouth at bedtime.   spironolactone 25 MG tablet Commonly known as: ALDACTONE Take 25 mg by mouth daily.   ticagrelor 90 MG Tabs tablet Commonly known as: BRILINTA Take 1 tablet (90 mg total) by mouth 2 (two) times daily.        Follow-up Information     Paraschos, Alexander, MD. Go in 1 week(s).   Specialty: Cardiology Contact information: [address] [facility]  [place] [address] [phone]                No Known Allergies  Consultations: cardio   Procedures/Studies: CARDIAC CATHETERIZATION  Result Date: 07/17/2021   Prox Cx to Mid Cx lesion is 30% stenosed.   Prox Cx lesion is 40% stenosed.   Mid LAD-1 lesion is 90% stenosed.   Mid LAD-2 lesion is 30% stenosed.   Prox RCA lesion is 90% stenosed.   A drug-eluting stent was successfully placed using a STENT ONYX FRONTIER 2.25X22.   A drug-eluting stent was successfully placed using a STENT ONYX FRONTIER 3.0X18.   Post intervention, there is a 0% residual stenosis.   Post intervention, there is a 0% residual stenosis.   The left ventricular systolic function is normal.   LV end diastolic pressure is normal.   The left ventricular ejection fraction is 55-65% by visual estimate. 1.  Two-vessel coronary artery disease with 90% stenosis mid LAD, 90% stenosis proximal/mid RCA 2.  Normal left ventricular function 3.  Successful two-vessel PCI with DES mid LAD and proximal/mid RCA Recommendations 1.  Dual antiplatelet therapy  uninterrupted x1 year 2.  Aggressive risk factor modification   DG Chest 2 View  Result Date: 07/14/2021 CLINICAL DATA:  Intermittent chest pain and shortness of breath EXAM: CHEST - 2 VIEW COMPARISON:  07/02/2020 FINDINGS: The heart size and mediastinal contours are within normal limits. Both lungs are clear. The visualized skeletal structures are unremarkable. IMPRESSION: No active cardiopulmonary disease. Electronically Signed   By: Randa Ngo M.D.   On: 07/14/2021 16:15   CT CORONARY MORPH W/CTA COR W/SCORE W/CA W/CM &/OR WO/CM  Addendum Date: 07/10/2021   ADDENDUM REPORT: 07/10/2021 16:25 EXAM: OVER-READ INTERPRETATION  CT CHEST The following report is an over-read performed by radiologist Dr. Maudry Diego [facility] on 07/10/2021. This over-read does not include interpretation of cardiac or coronary anatomy or pathology. The cardiac/coronary CTA interpretation by the cardiologist is attached. COMPARISON:  Chest CT dated October 22, 2020 FINDINGS: Vascular: Normal heart size. Trace pericardial fluid. No suspicious filling defects of the central pulmonary arteries. Normal caliber thoracic aorta with mild atherosclerotic disease. Mediastinum/Nodes: Small hiatal hernia. No pathologically enlarged lymph nodes seen in the chest. Lungs/Pleura: Central airways are patent. No consolidation, pleural effusion or pneumothorax. Upper Abdomen: No acute abnormality. Musculoskeletal: No chest wall mass or suspicious bone lesions identified. IMPRESSION: No acute extracardiac abnormality. Electronically Signed   By: Yetta Glassman M.D.   On: 07/10/2021 16:25   Result Date: 07/10/2021 CLINICAL DATA:  Chest pain EXAM: Cardiac/Coronary  CTA TECHNIQUE: The patient was scanned on a Siemens Somatom go.Top scanner. : A retrospective scan was triggered in the descending thoracic aorta. Axial non-contrast 3 mm slices were carried out through the heart. The data set was analyzed on a dedicated work  station and scored using the Altha. Gantry rotation speed was 330 msecs and collimation was .6 mm. '20mg'$  of metoprolol iv, and 0.8 mg of sl NTG was given. The 3D data set was reconstructed in 5% intervals of the 60-95 % of the R-R cycle. Diastolic phases were analyzed on a dedicated work station using MPR, MIP and VRT modes. The patient received 100 cc of contrast. FINDINGS: Aorta: Normal size. Minimal descending aorta calcifications. No dissection. Aortic Valve:  Trileaflet.  No calcifications. Coronary Arteries:  Normal coronary origin.  Right dominance. RCA is a dominant artery that gives rise to PDA and PLA. There is calcified plaque in the proximal RCA  causing mild stenosis (25-49%). Left main gives rise to LAD and LCX arteries. There is no LM disease. LAD has calcified plaque causing severe stenosis (>70%) in the mid segment. LCX is a non-dominant artery that gives rise to two obtuse marginal branches. There is calcified plaque in the proximal LCx causing minimal stenosis (<25%). Other findings: Normal pulmonary vein drainage into the left atrium. Normal left atrial appendage without a thrombus. Normal size of the pulmonary artery. IMPRESSION: 1. Coronary calcium score of 1149. This was 95th percentile for age and sex matched control. 2. Normal coronary origin with right dominance. 3. Calcified plaque causing severe stenosis (>70%) in the mid LAD. 4. Calcified plaque causing mild proximal RCA stenosis (25-49%). 5. CAD-RADS 4 Severe stenosis. (70-99% or > 50% left main). Cardiac catheterization is recommended. Consider symptom-guided anti-ischemic pharmacotherapy as well as risk factor modification per guideline directed care. 6.  Image quality degraded by obesity related artifacts. Electronically Signed: By: Kate Sable M.D. On: 07/10/2021 15:43   (Echo, Carotid, EGD, Colonoscopy, ERCP)    Subjective: Pt c/o intermittent shortness of breath    Discharge Exam: Vitals:   07/18/21 0751  07/18/21 1132  BP: (!) 162/96 (!) 148/89  Pulse: 61 91  Resp: 18 18  Temp: 97.6 F (36.4 C) 97.7 F (36.5 C)  SpO2: 96% 97%   Vitals:   07/17/21 2324 07/18/21 0606 07/18/21 0751 07/18/21 1132  BP:  (!) 158/98 (!) 162/96 (!) 148/89  Pulse:  64 61 91  Resp:  '17 18 18  '$ Temp:  98.2 F (36.8 C) 97.6 F (36.4 C) 97.7 F (36.5 C)  TempSrc:  Oral    SpO2: 95% 96% 96% 97%  Weight:      Height:        General: Pt is alert, awake, not in acute distress Cardiovascular: S1/S2 +, no rubs, no gallops Respiratory: CTA bilaterally, no wheezing, no rhonchi Abdominal: Soft, NT, obese, bowel sounds + Extremities:  no cyanosis    The results of significant diagnostics from this hospitalization (including imaging, microbiology, ancillary and laboratory) are listed below for reference.     Microbiology: No results found for this or any previous visit (from the past 240 hour(s)).   Labs: BNP (last 3 results) No results for input(s): "BNP" in the last 8760 hours. Basic Metabolic Panel: Recent Labs  Lab 07/14/21 1530 07/15/21 0654 07/16/21 0534 07/17/21 0506 07/18/21 0612  NA 138 137 139 138 137  K 3.7 3.5 3.6 3.4* 3.4*  CL 109 109 109 106 108  CO2 '23 23 25 23 23  '$ GLUCOSE 121* 103* 109* 106* 109*  BUN '18 17 22 19 15  '$ CREATININE 1.10 0.99 1.24 1.13 0.97  CALCIUM 9.2 9.2 9.1 9.1 8.9   Liver Function Tests: No results for input(s): "AST", "ALT", "ALKPHOS", "BILITOT", "PROT", "ALBUMIN" in the last 168 hours. No results for input(s): "LIPASE", "AMYLASE" in the last 168 hours. No results for input(s): "AMMONIA" in the last 168 hours. CBC: Recent Labs  Lab 07/14/21 1530 07/15/21 0654 07/16/21 0534 07/17/21 0506 07/18/21 0612  WBC 8.4 7.8 11.1* 8.7 9.1  HGB 17.7* 17.5* 16.1 16.1 16.4  HCT 49.7 49.2 46.1 46.2 46.9  MCV 84.5 84.1 86.3 86.4 86.5  PLT 238 228 210 200 202   Cardiac Enzymes: No results for input(s): "CKTOTAL", "CKMB", "CKMBINDEX", "TROPONINI" in the last 168  hours. BNP: Invalid input(s): "POCBNP" CBG: No results for input(s): "GLUCAP" in the last 168 hours. D-Dimer No results for input(s): "DDIMER" in  the last 72 hours. Hgb A1c No results for input(s): "HGBA1C" in the last 72 hours. Lipid Profile No results for input(s): "CHOL", "HDL", "LDLCALC", "TRIG", "CHOLHDL", "LDLDIRECT" in the last 72 hours. Thyroid function studies No results for input(s): "TSH", "T4TOTAL", "T3FREE", "THYROIDAB" in the last 72 hours.  Invalid input(s): "FREET3" Anemia work up No results for input(s): "VITAMINB12", "FOLATE", "FERRITIN", "TIBC", "IRON", "RETICCTPCT" in the last 72 hours. Urinalysis    Component Value Date/Time   COLORURINE BROWN (A) 06/24/2015 1400   APPEARANCEUR CLOUDY (A) 06/24/2015 1400   LABSPEC 1.025 06/24/2015 1400   PHURINE 7.0 06/24/2015 1400   GLUCOSEU NEGATIVE 06/24/2015 1400   HGBUR 3+ (A) 06/24/2015 1400   BILIRUBINUR NEGATIVE 06/24/2015 1400   KETONESUR NEGATIVE 06/24/2015 1400   PROTEINUR 30 (A) 06/24/2015 1400   NITRITE NEGATIVE 06/24/2015 1400   LEUKOCYTESUR NEGATIVE 06/24/2015 1400   Sepsis Labs Recent Labs  Lab 07/15/21 0654 07/16/21 0534 07/17/21 0506 07/18/21 0612  WBC 7.8 11.1* 8.7 9.1   Microbiology No results found for this or any previous visit (from the past 240 hour(s)).   Time coordinating discharge: Over 30 minutes  SIGNED:   Wyvonnia Dusky, MD  [facility] 07/18/2021, 12:38 PM Pager   If 7PM-7AM, please contact night-coverage [email or web address]

## 2021-07-19 LAB — LIPOPROTEIN A (LPA): Lipoprotein (a): 71.4 nmol/L — ABNORMAL HIGH (ref <75.0)

## 2021-07-20 ENCOUNTER — Ambulatory Visit
Admission: RE | Admit: 2021-07-20 | Payer: Managed Care, Other (non HMO) | Source: Ambulatory Visit | Admitting: Cardiology

## 2021-07-20 ENCOUNTER — Encounter: Admission: RE | Payer: Self-pay | Source: Ambulatory Visit

## 2021-07-20 DIAGNOSIS — R931 Abnormal findings on diagnostic imaging of heart and coronary circulation: Secondary | ICD-10-CM

## 2021-07-20 SURGERY — LEFT HEART CATH AND CORONARY ANGIOGRAPHY
Anesthesia: Moderate Sedation | Laterality: Left

## 2021-07-25 ENCOUNTER — Other Ambulatory Visit: Payer: Self-pay

## 2021-07-25 ENCOUNTER — Emergency Department: Payer: Managed Care, Other (non HMO)

## 2021-07-25 ENCOUNTER — Emergency Department
Admission: EM | Admit: 2021-07-25 | Discharge: 2021-07-25 | Disposition: A | Discharge status: Home / Self Care | Payer: Managed Care, Other (non HMO) | Attending: Emergency Medicine | Admitting: Emergency Medicine

## 2021-07-25 DIAGNOSIS — Z8719 Personal history of other diseases of the digestive system: Secondary | ICD-10-CM | POA: Insufficient documentation

## 2021-07-25 DIAGNOSIS — R079 Chest pain, unspecified: Secondary | ICD-10-CM

## 2021-07-25 DIAGNOSIS — R072 Precordial pain: Secondary | ICD-10-CM | POA: Insufficient documentation

## 2021-07-25 DIAGNOSIS — R0602 Shortness of breath: Secondary | ICD-10-CM | POA: Insufficient documentation

## 2021-07-25 DIAGNOSIS — I2510 Atherosclerotic heart disease of native coronary artery without angina pectoris: Secondary | ICD-10-CM | POA: Insufficient documentation

## 2021-07-25 LAB — BRAIN NATRIURETIC PEPTIDE: B Natriuretic Peptide: 27.7 pg/mL (ref 0.0–100.0)

## 2021-07-25 LAB — CBC WITH DIFFERENTIAL/PLATELET
Abs Immature Granulocytes: 0.03 10*3/uL (ref 0.00–0.07)
Basophils Absolute: 0.1 10*3/uL (ref 0.0–0.1)
Basophils Relative: 1 %
Eosinophils Absolute: 0.2 10*3/uL (ref 0.0–0.5)
Eosinophils Relative: 2 %
HCT: 48.3 % (ref 39.0–52.0)
Hemoglobin: 17 g/dL (ref 13.0–17.0)
Immature Granulocytes: 0 %
Lymphocytes Relative: 21 %
Lymphs Abs: 2.1 10*3/uL (ref 0.7–4.0)
MCH: 29.7 pg (ref 26.0–34.0)
MCHC: 35.2 g/dL (ref 30.0–36.0)
MCV: 84.3 fL (ref 80.0–100.0)
Monocytes Absolute: 0.9 10*3/uL (ref 0.1–1.0)
Monocytes Relative: 10 %
Neutro Abs: 6.5 10*3/uL (ref 1.7–7.7)
Neutrophils Relative %: 66 %
Platelets: 341 10*3/uL (ref 150–400)
RBC: 5.73 MIL/uL (ref 4.22–5.81)
RDW: 12.8 % (ref 11.5–15.5)
WBC: 9.8 10*3/uL (ref 4.0–10.5)
nRBC: 0 % (ref 0.0–0.2)

## 2021-07-25 LAB — COMPREHENSIVE METABOLIC PANEL
ALT: 57 U/L — ABNORMAL HIGH (ref 0–44)
AST: 45 U/L — ABNORMAL HIGH (ref 15–41)
Albumin: 4.3 g/dL (ref 3.5–5.0)
Alkaline Phosphatase: 105 U/L (ref 38–126)
Anion gap: 12 (ref 5–15)
BUN: 26 mg/dL — ABNORMAL HIGH (ref 8–23)
CO2: 20 mmol/L — ABNORMAL LOW (ref 22–32)
Calcium: 10.2 mg/dL (ref 8.9–10.3)
Chloride: 106 mmol/L (ref 98–111)
Creatinine, Ser: 1.47 mg/dL — ABNORMAL HIGH (ref 0.61–1.24)
GFR, Estimated: 53 mL/min — ABNORMAL LOW (ref >60)
Glucose, Bld: 114 mg/dL — ABNORMAL HIGH (ref 70–99)
Potassium: 3.6 mmol/L (ref 3.5–5.1)
Sodium: 138 mmol/L (ref 135–145)
Total Bilirubin: 1.3 mg/dL — ABNORMAL HIGH (ref 0.3–1.2)
Total Protein: 8.3 g/dL — ABNORMAL HIGH (ref 6.5–8.1)

## 2021-07-25 LAB — TROPONIN I (HIGH SENSITIVITY)
Troponin I (High Sensitivity): 6 ng/L (ref <18)
Troponin I (High Sensitivity): 6 ng/L (ref <18)

## 2021-07-25 LAB — PROTIME-INR
INR: 1 (ref 0.8–1.2)
Prothrombin Time: 13.3 seconds (ref 11.4–15.2)

## 2021-07-25 MED ORDER — IOHEXOL 350 MG/ML SOLN
75.0000 mL | Freq: Once | INTRAVENOUS | Status: AC | PRN
  Administered 2021-07-25: 75 mL via INTRAVENOUS

## 2021-07-25 NOTE — ED Notes (Signed)
Pt reports feeling sob and chest pain.  No cough  no fever.  Non smoker.  Cardiac stents 07/17/21.  No n/v/  pt reports intermittent dizziness.  Pt alert  speech clear.  Nsr on monitor.  Family with pt.

## 2021-07-25 NOTE — ED Provider Triage Note (Signed)
Emergency Medicine Provider Triage Evaluation Note  Ian Allen , a 66 y.o. male  was evaluated in triage.  Pt complains of intermittent chest pain with significant shortness of breath.  Patient was recently admitted, had a heart cath performed with 90% occlusion in the LAD.  Patient had 2 drug-eluting stents placed.  He is on Brilinta.  He states that initially he was on the phone to make make him short of breath the patient has been breathing at a rate of 30-40 times a minute.  O2 saturations are stable in the upper 90 percentile.  He does have intermittent substernal chest pain.  No GI complaints.  No fevers or chills.  Review of Systems  Positive: Shortness of breath, tachypnea, intermittent chest pain Negative: GI symptoms, fevers or chills  Physical Exam  BP 111/82 (BP Location: Right Arm)   Pulse 95   Temp 97.9 F (36.6 C) (Oral)   Resp (!) 24   SpO2 98%  Gen:   Awake, no distress   Resp:  Normal effort but tachypnea sustained at 30-40 respirations/min MSK:   Moves extremities without difficulty  Other:    Medical Decision Making  Medically screening exam initiated at 5:11 PM.  Appropriate orders placed.  Homero Fellers was informed that the remainder of the evaluation will be completed by another provider, this initial triage assessment does not replace that evaluation, and the importance of remaining in the ED until their evaluation is complete.  Patient presents a week after having to stents placed in LAD with brillinta at home. Intermittent CP with associated SOB and tachypnea. EKG, trop, labs, xray at this time   Darletta Moll, PA-C 07/25/21 1711

## 2021-07-25 NOTE — ED Triage Notes (Signed)
Pt presents w/ chest pain, SOB and tachypnea x "a long time." Pain score 1/10. Pt reports symptoms have increased this afternoon.  Pt reports having stents placed on 7/17.

## 2021-07-25 NOTE — Discharge Instructions (Signed)
Please take your omeprazole at least 2 hours prior to going to sleep

## 2021-07-27 ENCOUNTER — Ambulatory Visit: Payer: Self-pay

## 2021-07-27 NOTE — Telephone Encounter (Signed)
  Chief Complaint: SOB Symptoms: SOB Frequency: ongoing 07/25/21 when he went to ED Pertinent Negatives: NA Disposition: '[x]'$ ED /'[]'$ Urgent Care (no appt availability in office) / '[]'$ Appointment(In office/virtual)/ '[]'$  [place] [facility]/ '[]'$ Home Care/ '[]'$ Refused Recommended Disposition /'[]'$ [facility]/ '[]'$  Follow-up with PCP Additional Notes: pt's daughter calling to report pt's symptoms aren't any better and went to ED on 07/25/21 and was told he had PNA but no abx was given. I advised her since pt is still SOB needed to go back to ED for re-eval or could call PCP and request abx. She verbalized understanding.   Reason for Disposition  [1] MODERATE difficulty breathing (e.g., speaks in phrases, SOB even at rest, pulse 100-120) AND [2] NEW-onset or WORSE than normal  Answer Assessment - Initial Assessment Questions 1. RESPIRATORY STATUS: "Describe your breathing?" (e.g., wheezing, shortness of breath, unable to speak, severe coughing)      SOB 2. ONSET: "When did this breathing problem begin?"      Ongoing since 07/25/21 4. SEVERITY: "How bad is your breathing?" (e.g., mild, moderate, severe)    - MILD: No SOB at rest, mild SOB with walking, speaks normally in sentences, can lie down, no retractions, pulse < 100.    - MODERATE: SOB at rest, SOB with minimal exertion and prefers to sit, cannot lie down flat, speaks in phrases, mild retractions, audible wheezing, pulse 100-120.    - SEVERE: Very SOB at rest, speaks in single words, struggling to breathe, sitting hunched forward, retractions, pulse > 120      Daughter didn't say 8. CAUSE: "What do you think is causing the breathing problem?"      PNA 9. OTHER SYMPTOMS: "Do you have any other symptoms? (e.g., dizziness, runny nose, cough, chest pain, fever)     Pt went to ED on 07/25/21  Protocols used: Breathing Difficulty-A-AH

## 2021-07-31 NOTE — ED Provider Notes (Signed)
[facility] Provider Note   Event Date/Time   First MD Initiated Contact with Patient 07/25/21 2046     (approximate) History  Shortness of Breath and Chest Pain  HPI Ian Allen is a 66 y.o. male with a stated past medical history of CAD status post stent placement on 07/17/2021 with 2 drug-eluting stents to the LAD and being placed on Brilinta who presents for continued substernal chest pain with associated shortness of breath.  Patient states that on occasion he has noticed his breathing rate to be up to 30-40 times a minute.  Patient states that his breathing difficulty has been in place since getting the stents.  Patient denies any exacerbating or relieving factors other than exertion. ROS: Patient currently denies any vision changes, tinnitus, difficulty speaking, facial droop, sore throat, abdominal pain, nausea/vomiting/diarrhea, dysuria, or weakness/numbness/paresthesias in any extremity   Physical Exam  Triage Vital Signs: ED Triage Vitals  Enc Vitals Group     BP 07/25/21 1702 111/82     Pulse Rate 07/25/21 1702 95     Resp 07/25/21 1702 (!) 24     Temp 07/25/21 1702 97.9 F (36.6 C)     Temp Source 07/25/21 1702 Oral     SpO2 07/25/21 1702 98 %     Weight 07/25/21 1712 259 lb (117.5 kg)     Height 07/25/21 1712 6' (1.829 m)     Head Circumference --      Peak Flow --      Pain Score 07/25/21 1712 1     Pain Loc --      Pain Edu? --      Excl. in [place]? --    Most recent vital signs: Vitals:   07/25/21 1943 07/25/21 2145  BP: 104/80 120/83  Pulse: 91 79  Resp: (!) 24 (!) 32  Temp: 97.7 F (36.5 C)   SpO2: 96% 96%   General: Awake, oriented x4. CV:  Good peripheral perfusion.  Resp:  Normal effort.  Abd:  No distention.  Other:  Middle-aged Caucasian male laying in bed in no acute distress ED Results / Procedures / Treatments  Labs (all labs ordered are listed, but only abnormal results are displayed) Labs Reviewed  COMPREHENSIVE  METABOLIC PANEL - Abnormal; Notable for the following components:      Result Value   CO2 20 (*)    Glucose, Bld 114 (*)    BUN 26 (*)    Creatinine, Ser 1.47 (*)    Total Protein 8.3 (*)    AST 45 (*)    ALT 57 (*)    Total Bilirubin 1.3 (*)    GFR, Estimated 53 (*)    All other components within normal limits  BRAIN NATRIURETIC PEPTIDE  CBC WITH DIFFERENTIAL/PLATELET  PROTIME-INR  TROPONIN I (HIGH SENSITIVITY)  TROPONIN I (HIGH SENSITIVITY)   EKG ED ECG REPORT I, Naaman Plummer, the attending physician, personally viewed and interpreted this ECG. 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 PROCEDURES: Critical  Care performed: No Procedures MEDICATIONS ORDERED IN ED: Medications  iohexol (OMNIPAQUE) 350 MG/ML injection 75 mL (75 mLs Intravenous Contrast Given 07/25/21 2155)   IMPRESSION / MDM / ASSESSMENT AND PLAN / ED COURSE  I reviewed the triage vital signs and the nursing notes.                             The patient is on the cardiac monitor to evaluate for evidence of arrhythmia and/or significant heart rate changes. Patient's presentation is most consistent with acute presentation with potential threat to life or bodily function. Workup: ECG, CXR, CBC, BMP, Troponin Findings: ECG: No overt evidence of STEMI. No evidence of Brugadas sign, delta wave, epsilon wave, significantly prolonged QTc, or malignant  arrhythmia HS Troponin: Negative x1 Other Labs unremarkable for emergent problems. CXR: Without PTX, PNA, or widened mediastinum Last Stress Test: Never Last Heart Catheterization: Never HEART Score: 3  Given History, Exam, and Workup I have low suspicion for ACS, Pneumothorax, Pneumonia, Pulmonary Embolus, Tamponade, Aortic Dissection or other emergent problem as a cause for this presentation.   Reassesment: Prior to discharge patients pain was controlled and they were well appearing.  Disposition:  Discharge. Strict return precautions discussed with patient with full understanding. Advised patient to follow up promptly with primary care provider   FINAL CLINICAL IMPRESSION(S) / ED DIAGNOSES   Final diagnoses:  Chest pain, unspecified type  Shortness of breath  History of gastroesophageal reflux (GERD)   Rx / DC Orders   ED Discharge Orders     None      Note:  This document was prepared using Dragon voice recognition software and may include unintentional dictation errors.   Naaman Plummer, MD 08/02/21 [phone]

## 2021-09-05 ENCOUNTER — Ambulatory Visit: Payer: Managed Care, Other (non HMO)

## 2022-12-02 IMAGING — CR DG ABDOMEN ACUTE W/ 1V CHEST
5 series · 5 of 5 positions shown · non-contrast
Comparison: Esophagram 09/02/2018

CLINICAL DATA: Patient choked on a piece of chicken, still stuck in
throat. Similar episode about 3 weeks ago.

EXAM:
DG ABDOMEN ACUTE WITH 1 VIEW CHEST

[w chest pa]
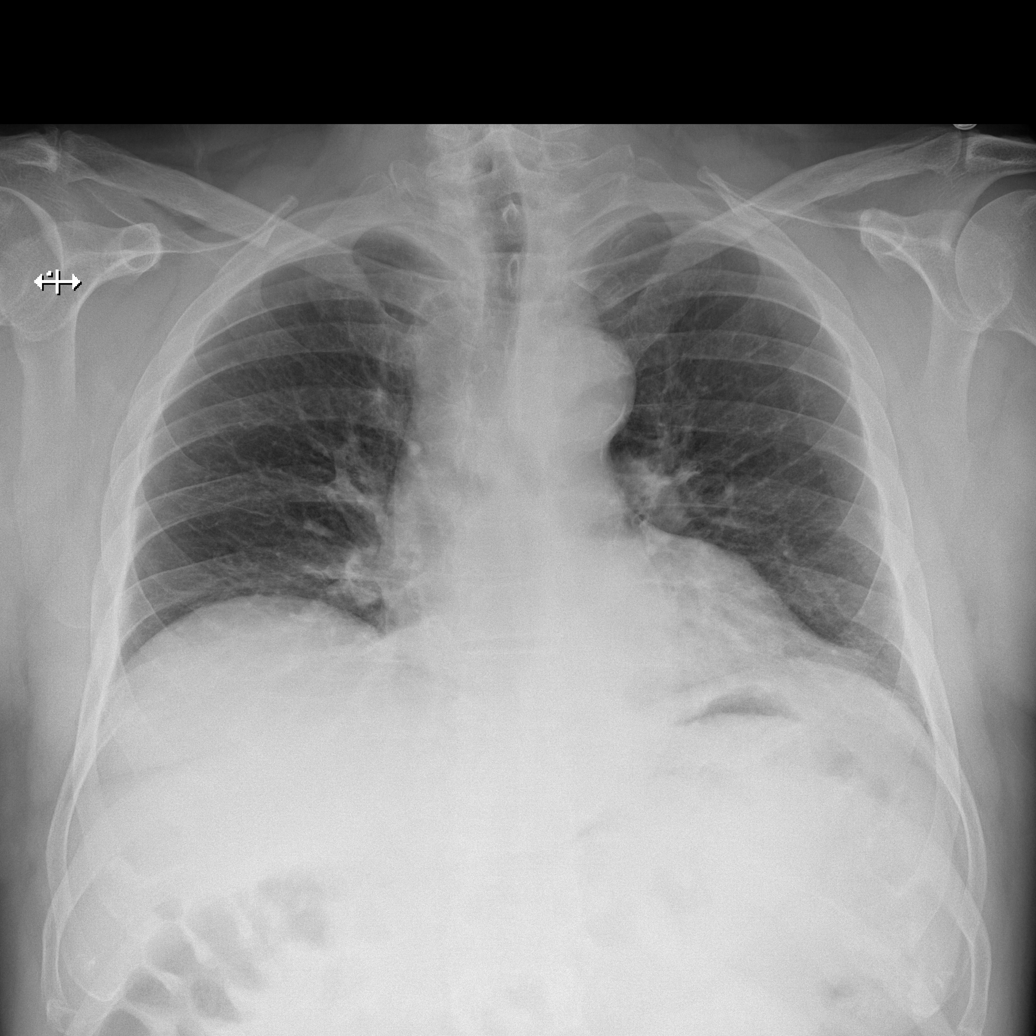

[w abdomen upright (1 of 2)]
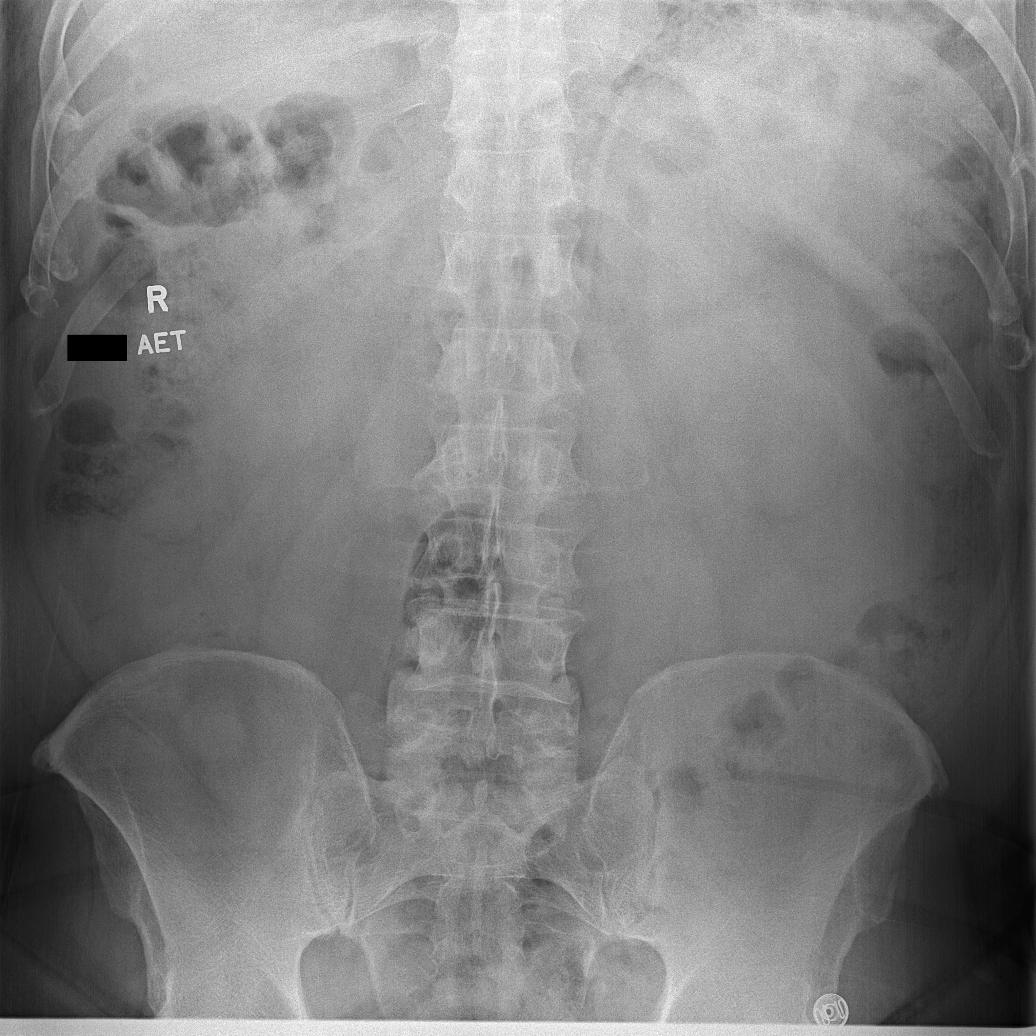

[w abdomen upright (2 of 2)]
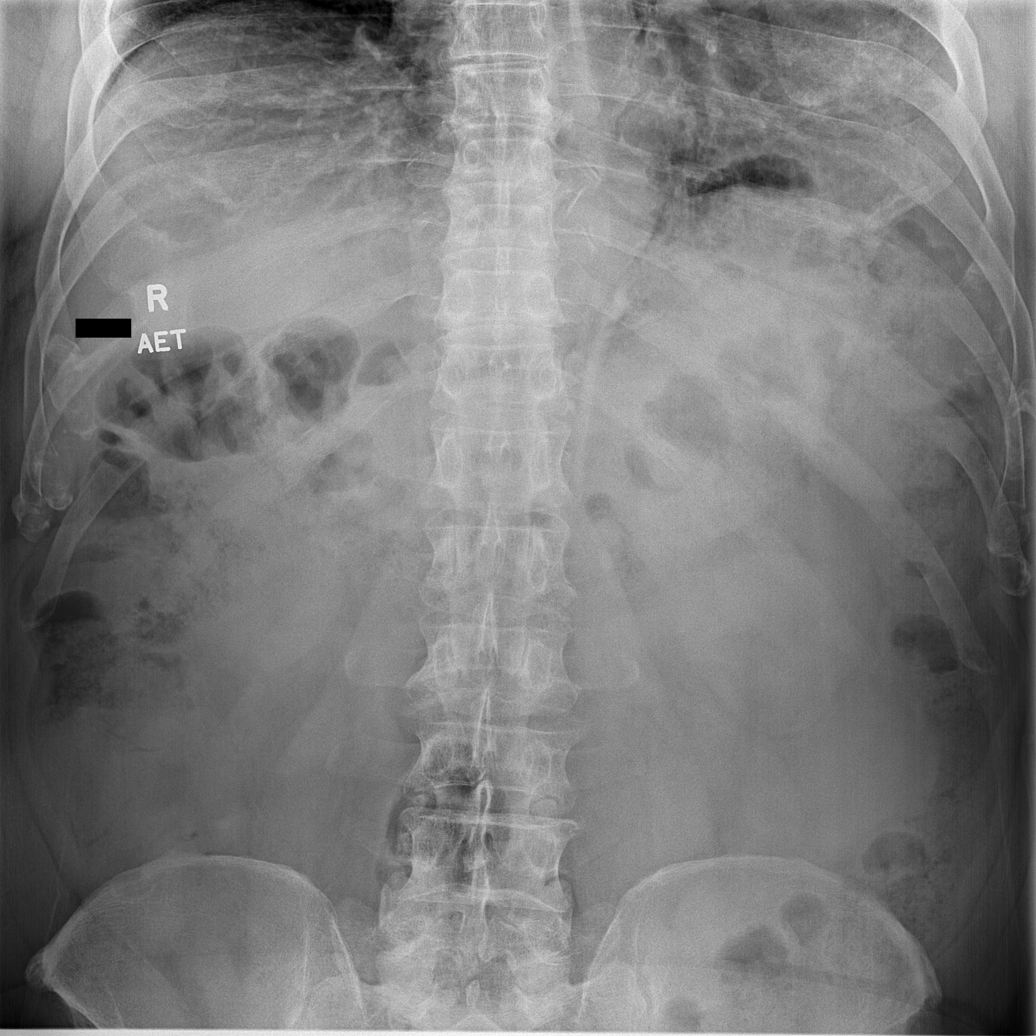

[t abdomen supine (1 of 2)]
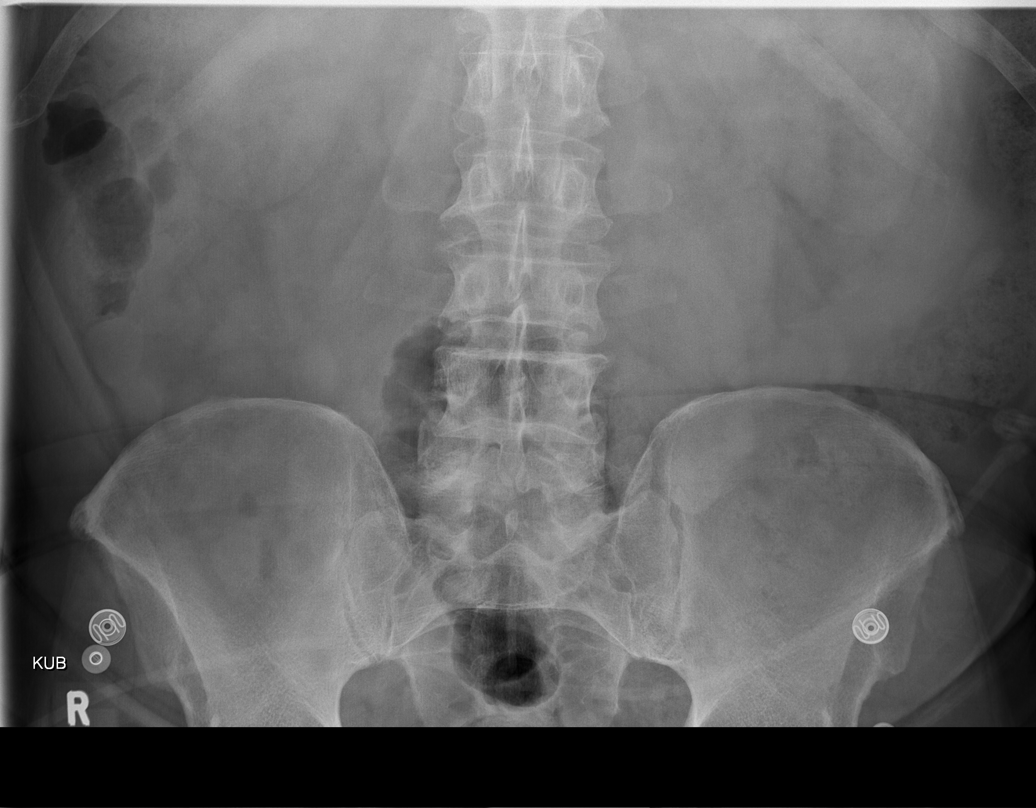

[t abdomen supine (2 of 2)]
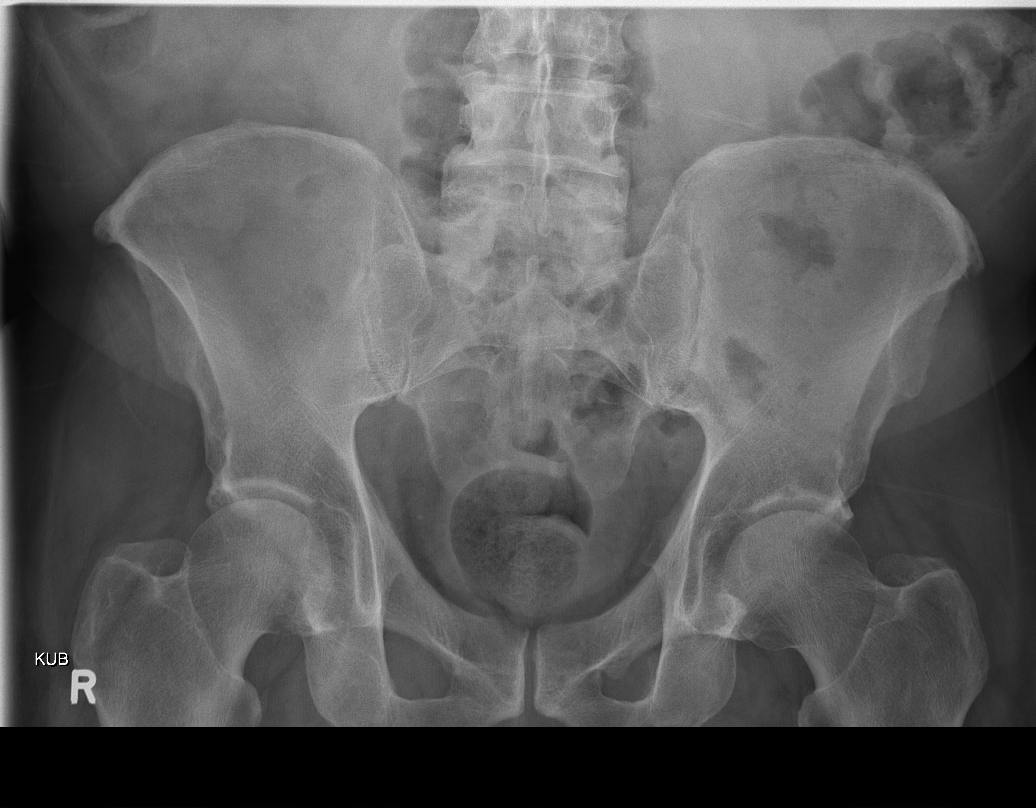

[5 of 5 positions shown; findings below may reference images not displayed]

FINDINGS: Shallow inspiration with linear atelectasis in the lung bases. Heart
size and pulmonary vascularity are normal. No airspace disease or
consolidation in the lungs. No pleural effusions. No pneumothorax.
Calcified and tortuous aorta.

Gas and stool scattered throughout the colon. No small or large
bowel distention. No free intra-abdominal air. No abnormal air-fluid
levels. No radiopaque stones. Visualized bones and soft tissue
contours appear intact.

No radiopaque foreign bodies are demonstrated.
IMPRESSION: Shallow inspiration with linear atelectasis in the lung bases.
Normal nonobstructive bowel gas pattern. No radiopaque foreign
bodies demonstrated.
